# Patient Record
Sex: Female | Born: 1990 | Race: Black or African American | Hispanic: No | Marital: Married | Smoking: Former smoker
Health system: Southern US, Community
[De-identification: ages and names within clinical notes are randomized; demographics above are authoritative.]

## PROBLEM LIST (undated history)

## (undated) DIAGNOSIS — T7840XA Allergy, unspecified, initial encounter: Secondary | ICD-10-CM

## (undated) DIAGNOSIS — H5210 Myopia, unspecified eye: Secondary | ICD-10-CM

## (undated) DIAGNOSIS — D649 Anemia, unspecified: Secondary | ICD-10-CM

## (undated) HISTORY — DX: Myopia, unspecified eye: H52.10

## (undated) HISTORY — DX: Allergy, unspecified, initial encounter: T78.40XA

## (undated) HISTORY — DX: Anemia, unspecified: D64.9

---
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
## 2009-03-18 ENCOUNTER — Emergency Department (HOSPITAL_COMMUNITY): Admission: EM | Admit: 2009-03-18 | Discharge: 2009-03-18 | Payer: Self-pay | Admitting: Emergency Medicine

## 2009-05-07 ENCOUNTER — Emergency Department (HOSPITAL_COMMUNITY): Admission: EM | Admit: 2009-05-07 | Discharge: 2009-05-07 | Payer: Self-pay | Admitting: Family Medicine

## 2010-06-01 ENCOUNTER — Emergency Department (HOSPITAL_COMMUNITY): Admission: EM | Admit: 2010-06-01 | Discharge: 2010-06-01 | Payer: Self-pay | Admitting: Emergency Medicine

## 2010-07-15 ENCOUNTER — Emergency Department (HOSPITAL_COMMUNITY): Admission: EM | Admit: 2010-07-15 | Discharge: 2010-03-17 | Payer: Self-pay | Admitting: Emergency Medicine

## 2010-10-10 ENCOUNTER — Emergency Department (HOSPITAL_COMMUNITY)
Admission: EM | Admit: 2010-10-10 | Discharge: 2010-10-10 | Disposition: A | Discharge status: Home / Self Care | Payer: Self-pay | Attending: Emergency Medicine | Admitting: Emergency Medicine

## 2010-10-10 DIAGNOSIS — K029 Dental caries, unspecified: Secondary | ICD-10-CM | POA: Insufficient documentation

## 2010-10-10 DIAGNOSIS — J45909 Unspecified asthma, uncomplicated: Secondary | ICD-10-CM | POA: Insufficient documentation

## 2010-10-10 DIAGNOSIS — K089 Disorder of teeth and supporting structures, unspecified: Secondary | ICD-10-CM | POA: Insufficient documentation

## 2010-11-23 LAB — URINALYSIS, ROUTINE W REFLEX MICROSCOPIC
Hgb urine dipstick: NEGATIVE
Protein, ur: NEGATIVE mg/dL
Urobilinogen, UA: 1 mg/dL (ref 0.0–1.0)

## 2010-11-23 LAB — URINE MICROSCOPIC-ADD ON

## 2010-11-23 LAB — POCT PREGNANCY, URINE: Preg Test, Ur: NEGATIVE

## 2010-11-23 LAB — WET PREP, GENITAL
Trich, Wet Prep: NONE SEEN
Yeast Wet Prep HPF POC: NONE SEEN

## 2011-05-09 LAB — DIFFERENTIAL
Basophils Absolute: 0.1
Basophils Relative: 1
Lymphocytes Relative: 40
Monocytes Absolute: 0.4
Neutro Abs: 3
Neutrophils Relative %: 48

## 2011-05-09 LAB — URINALYSIS, ROUTINE W REFLEX MICROSCOPIC
Nitrite: NEGATIVE
Protein, ur: 30 — AB
Specific Gravity, Urine: 1.024
Urobilinogen, UA: 0.2

## 2011-05-09 LAB — CBC
HCT: 35 — ABNORMAL LOW
Hemoglobin: 11.6 — ABNORMAL LOW
MCV: 96
Platelets: 318
RDW: 13.8

## 2011-05-09 LAB — COMPREHENSIVE METABOLIC PANEL
Albumin: 3.6
Alkaline Phosphatase: 60
BUN: 9
Creatinine, Ser: 0.73
Glucose, Bld: 89
Total Bilirubin: 0.4
Total Protein: 7.1

## 2011-05-09 LAB — URINE MICROSCOPIC-ADD ON

## 2011-05-20 ENCOUNTER — Emergency Department (HOSPITAL_COMMUNITY): Payer: Self-pay

## 2011-05-20 ENCOUNTER — Emergency Department (HOSPITAL_COMMUNITY)
Admission: EM | Admit: 2011-05-20 | Discharge: 2011-05-20 | Disposition: A | Discharge status: Home / Self Care | Payer: Self-pay | Attending: Emergency Medicine | Admitting: Emergency Medicine

## 2011-05-20 DIAGNOSIS — M25569 Pain in unspecified knee: Secondary | ICD-10-CM | POA: Insufficient documentation

## 2011-05-20 DIAGNOSIS — M545 Low back pain, unspecified: Secondary | ICD-10-CM | POA: Insufficient documentation

## 2011-05-20 DIAGNOSIS — J45909 Unspecified asthma, uncomplicated: Secondary | ICD-10-CM | POA: Insufficient documentation

## 2011-05-20 LAB — URINALYSIS, ROUTINE W REFLEX MICROSCOPIC
Bilirubin Urine: NEGATIVE
Glucose, UA: NEGATIVE mg/dL
Specific Gravity, Urine: 1.02 (ref 1.005–1.030)
Urobilinogen, UA: 0.2 mg/dL (ref 0.0–1.0)
pH: 6 (ref 5.0–8.0)

## 2011-05-20 LAB — URINE MICROSCOPIC-ADD ON

## 2011-05-20 LAB — POCT PREGNANCY, URINE: Preg Test, Ur: NEGATIVE

## 2011-06-04 ENCOUNTER — Emergency Department (HOSPITAL_COMMUNITY)
Admission: EM | Admit: 2011-06-04 | Discharge: 2011-06-04 | Disposition: A | Discharge status: Home / Self Care | Payer: Self-pay | Attending: Emergency Medicine | Admitting: Emergency Medicine

## 2011-06-04 DIAGNOSIS — R51 Headache: Secondary | ICD-10-CM | POA: Insufficient documentation

## 2011-06-04 DIAGNOSIS — K029 Dental caries, unspecified: Secondary | ICD-10-CM | POA: Insufficient documentation

## 2011-06-04 DIAGNOSIS — J45909 Unspecified asthma, uncomplicated: Secondary | ICD-10-CM | POA: Insufficient documentation

## 2011-06-04 DIAGNOSIS — K089 Disorder of teeth and supporting structures, unspecified: Secondary | ICD-10-CM | POA: Insufficient documentation

## 2011-07-11 ENCOUNTER — Emergency Department (HOSPITAL_COMMUNITY)
Admission: EM | Admit: 2011-07-11 | Discharge: 2011-07-11 | Discharge status: Against Medical Advice | Payer: Self-pay | Attending: Emergency Medicine | Admitting: Emergency Medicine

## 2011-07-11 DIAGNOSIS — M25569 Pain in unspecified knee: Secondary | ICD-10-CM | POA: Insufficient documentation

## 2011-07-21 ENCOUNTER — Encounter: Payer: Self-pay | Admitting: Nurse Practitioner

## 2011-07-21 ENCOUNTER — Emergency Department (HOSPITAL_COMMUNITY)
Admission: EM | Admit: 2011-07-21 | Discharge: 2011-07-21 | Disposition: A | Discharge status: Home / Self Care | Payer: Self-pay | Source: Home / Self Care

## 2011-07-21 ENCOUNTER — Observation Stay (HOSPITAL_COMMUNITY)
Admission: EM | Admit: 2011-07-21 | Discharge: 2011-07-21 | Disposition: A | Discharge status: Home / Self Care | Payer: Self-pay | Attending: Emergency Medicine | Admitting: Emergency Medicine

## 2011-07-21 DIAGNOSIS — K089 Disorder of teeth and supporting structures, unspecified: Principal | ICD-10-CM | POA: Insufficient documentation

## 2011-07-21 MED ORDER — PENICILLIN V POTASSIUM 500 MG PO TABS: 500.0000 mg | ORAL_TABLET | Freq: Three times a day (TID) | ORAL | Status: AC

## 2011-07-21 MED ORDER — OXYCODONE-ACETAMINOPHEN 5-325 MG PO TABS: 1.0000 | ORAL_TABLET | ORAL | Status: AC | PRN

## 2011-07-21 NOTE — ED Notes (Signed)
Pt c/o tooth L lower back toothache x 2 months. Had a dental appointment today but was unable to make it due to road work.

## 2011-07-21 NOTE — ED Provider Notes (Signed)
History     CSN: [id number] Arrival date & time: 07/21/2011 11:52 AM   First MD Initiated Contact with Patient 07/21/11 1210     12:43 PM Patient reports several months of left lower toothache. States she had a dentist appointment states she was unable to make it. States that dentist office called her and said that the dentist was unable to come in today. Denies fever, abscess, swelling, difficulty swallowing, difficulty breathing. Reports known cavity in third lower left molar. Patient is a 20 y.o. female presenting with tooth pain. The history is provided by the patient.  Dental PainThe primary symptoms include mouth pain and headaches. Primary symptoms do not include dental injury, oral bleeding, oral lesions, fever, shortness of breath, sore throat, angioedema or cough. The symptoms are worsening. The symptoms are chronic. The symptoms occur constantly.  Mouth pain occurs constantly. Mouth pain is worsening. Affected locations include: teeth. At its highest the mouth pain was at 10/10. The mouth pain is currently at 10/10.  The headache is not associated with neck stiffness.  Additional symptoms include: dental sensitivity to temperature, jaw pain and ear pain. Additional symptoms do not include: gum swelling, gum tenderness, purulent gums, trismus, trouble swallowing, pain with swallowing and swollen glands.    Past Medical History  Diagnosis Date  . Asthma     History reviewed. No pertinent past surgical history.  History reviewed. No pertinent family history.  History  Substance Use Topics  . Smoking status: Never Smoker   . Smokeless tobacco: Not on file  . Alcohol Use: No    OB History    Grav Para Term Preterm Abortions TAB SAB Ect Mult Living                  Review of Systems  Constitutional: Negative for fever and chills.  HENT: Positive for ear pain. Negative for sore throat, trouble swallowing, neck pain and neck stiffness.        Positive for toothache    Respiratory: Negative for cough and shortness of breath.   Neurological: Positive for headaches. Negative for dizziness and light-headedness.  All other systems reviewed and are negative.    Allergies  Review of patient's allergies indicates no known allergies.  Home Medications   Current Outpatient Rx  Name Route Sig Dispense Refill  . IBUPROFEN 600 MG PO TABS Oral Take 600 mg by mouth daily as needed. For pain       BP 120/80  Pulse 89  Temp(Src) 98.1 F (36.7 C) (Oral)  Resp 16  Ht 5\' 4"  (1.626 m)  Wt 260 lb (117.935 kg)  BMI 44.63 kg/m2  SpO2 97%  Physical Exam  Vitals reviewed. Constitutional: She is oriented to person, place, and time. Vital signs are normal. She appears well-developed and well-nourished. No distress.  HENT:  Head: Normocephalic and atraumatic. No trismus in the jaw.  Mouth/Throat: Uvula is midline, oropharynx is clear and moist and mucous membranes are normal. Dental caries present. No dental abscesses or uvula swelling.    Eyes: Pupils are equal, round, and reactive to light.  Neck: Neck supple.  Pulmonary/Chest: Effort normal.  Neurological: She is alert and oriented to person, place, and time.  Skin: Skin is warm and dry. No rash noted. No erythema. No pallor.  Psychiatric: She has a normal mood and affect. Her behavior is normal.    ED Course  Procedures   MDM   Spoke with Dr. Marlane Hatcher office. Patient has an appointment scheduled  for tomorrow at 12 PM. Will prescribe patient antibiotics and pain medication so appointment can occur.       Thomasene Lot, [place] 07/21/11 1254  Thomasene Lot, PA 07/21/11 1301  1:38 PM Patient reports she's had some vaginal itching after taking penicillin. Denies vaginal discharge. Refusing to have pelvic exam. Will provide patient with recommendations for vaginal itching. Advised return if she feels she may have a yeast infection.   Thomasene Lot, [place] 07/21/11 [phone]

## 2011-07-21 NOTE — ED Notes (Signed)
Patient C/O having tooth pain in her left lower jaw. C/O having a headache for 2 days and states that her left ear is uncomfortable.  States that she was scheduled to see her dentist today but she was not seen. States that she came directly to the ED.  States that her tooth pain has been intermittent for 2 months, but is now continuous.

## 2011-07-21 NOTE — ED Provider Notes (Signed)
Medical screening examination/treatment/procedure(s) were performed by non-physician practitioner and as supervising physician I was immediately available for consultation/collaboration.  Doug Sou, MD 07/21/11 2043

## 2011-09-07 ENCOUNTER — Encounter: Payer: Self-pay | Admitting: Medical

## 2011-09-07 ENCOUNTER — Ambulatory Visit (INDEPENDENT_AMBULATORY_CARE_PROVIDER_SITE_OTHER): Payer: Self-pay | Admitting: Medical

## 2011-09-07 VITALS — BP 110/70 | HR 100 | Temp 98.1°F | Resp 18 | Ht 62.5 in | Wt 266.0 lb

## 2011-09-07 DIAGNOSIS — G479 Sleep disorder, unspecified: Secondary | ICD-10-CM

## 2011-09-07 DIAGNOSIS — E669 Obesity, unspecified: Secondary | ICD-10-CM

## 2011-09-07 DIAGNOSIS — M549 Dorsalgia, unspecified: Secondary | ICD-10-CM

## 2011-09-07 DIAGNOSIS — M25569 Pain in unspecified knee: Secondary | ICD-10-CM

## 2011-09-07 NOTE — Patient Instructions (Signed)
Based on your symptoms, you may have sleep apnea.    Given your sleep symptoms, back pain and knee pain, I do think this is related in part to your weight.   I recommend you make every attempt possible to work on losing weight.   Try and keep your daily food and beverage intake to 1800 calories per day. Exercise daily, preferably 30-60 minutes daily such as walking.    Consider Weight Watchers program to help with weight loss.   Stretch daily to help the back.    Regarding sleep issues - elevate the head of your bed.   Sleep Apnea Sleep apnea is a common disorder. The main problem of this disorder is excessive daytime sleepiness and compromised quality of life. This may include social and emotional problems. There are two types of sleep apnea.  Obstructive sleep apnea is when breathing stops due to a blocked airway.   Central sleep apnea is a malfunction of the brain's normal signal to breathe.  SYMPTOMS  Restless sleep.   Falling asleep while driving and/or during the day.   Loss of energy.   Irritability.   Mood or behavior changes.   Loud, heavy snoring.   Morning headaches.   Trouble concentrating.   Forgetfulness.   Anxiety or depression.   Decreased interest in sex.  Not all people with sleep apnea have all of these symptoms. However, people who have a few of these symptoms should visit their caregiver for an evaluation. Problems related to untreated sleep apnea include:  High blood pressure (hypertension).   Coronary artery disease.   Impotence.   Cognitive dysfunction.   Memory loss.  TREATMENT  For mild cases, treatment may include avoiding sleeping on one's back.   For people with nasal congestion, a decongestant may be prescribed.   Patients with obstructive and central apnea should avoid depressants. This includes alcohol, sedatives and narcotics. Weight loss and diet control are encouraged for overweight patients.   Many serious cases of  obstructive sleep apnea can be relieved by a treatment called nasal continuous positive airway pressure (nasal CPAP). Nasal CPAP uses a mask-like device and pump that work together to keep the airway open. The pump delivers air pressure during each breath.   Surgery may help some patients by stopping or reducing the narrowing of the airway due to anatomical defects.  PROGNOSIS  Removing the obstruction usually reverses hypertension and cardiac problems. Untreated, sleep apnea sufferers have a tendency to fall asleep during the day. This is can result in serious accident or loss of ones job. RESEARCH Sleep apnea is currently one of the most active areas of sleep research.  Document Released: 07/15/2002 Document Revised: 04/06/2011 Document Reviewed: 11/10/2005 [facility] Patient Information 2012 [place], [place].  Back Pain, Adult Low back pain is very common. About 1 in 5 people have back pain.The cause of low back pain is rarely dangerous. The pain often gets better over time.About half of people with a sudden onset of back pain feel better in just 2 weeks. About 8 in 10 people feel better by 6 weeks.  CAUSES Some common causes of back pain include:  Strain of the muscles or ligaments supporting the spine.   Wear and tear (degeneration) of the spinal discs.   Arthritis.   Direct injury to the back.  DIAGNOSIS Most of the time, the direct cause of low back pain is not known.However, back pain can be treated effectively even when the exact cause of the pain is  unknown.Answering your caregiver's questions about your overall health and symptoms is one of the most accurate ways to make sure the cause of your pain is not dangerous. If your caregiver needs more information, he or she may order lab work or imaging tests (X-rays or MRIs).However, even if imaging tests show changes in your back, this usually does not require surgery. HOME CARE INSTRUCTIONS For many people, back pain returns.Since  low back pain is rarely dangerous, it is often a condition that people can learn to [facility] their own.   Remain active. It is stressful on the back to sit or stand in one place. Do not sit, drive, or stand in one place for more than 30 minutes at a time. Take short walks on level surfaces as soon as pain allows.Try to increase the length of time you walk each day.   Do not stay in bed.Resting more than 1 or 2 days can delay your recovery.   Do not avoid exercise or work.Your body is made to move.It is not dangerous to be active, even though your back may hurt.Your back will likely heal faster if you return to being active before your pain is gone.   Pay attention to your body when you bend and lift. Many people have less discomfortwhen lifting if they bend their knees, keep the load close to their bodies,and avoid twisting. Often, the most comfortable positions are those that put less stress on your recovering back.   Find a comfortable position to sleep. Use a firm mattress and lie on your side with your knees slightly bent. If you lie on your back, put a pillow under your knees.   Only take over-the-counter or prescription medicines as directed by your caregiver. Over-the-counter medicines to reduce pain and inflammation are often the most helpful.Your caregiver may prescribe muscle relaxant drugs.These medicines help dull your pain so you can more quickly return to your normal activities and healthy exercise.   Put ice on the injured area.   Put ice in a plastic bag.   Place a towel between your skin and the bag.   Leave the ice on for 15 to 20 minutes, 3 to 4 times a day for the first 2 to 3 days. After that, ice and heat may be alternated to reduce pain and spasms.   Ask your caregiver about trying back exercises and gentle massage. This may be of some benefit.   Avoid feeling anxious or stressed.Stress increases muscle tension and can worsen back pain.It is important to  recognize when you are anxious or stressed and learn ways to manage it.Exercise is a great option.  SEEK MEDICAL CARE IF:  You have pain that is not relieved with rest or medicine.   You have pain that does not improve in 1 week.   You have new symptoms.   You are generally not feeling well.  SEEK IMMEDIATE MEDICAL CARE IF:   You have pain that radiates from your back into your legs.   You develop new bowel or bladder control problems.   You have unusual weakness or numbness in your arms or legs.   You develop nausea or vomiting.   You develop abdominal pain.   You feel faint.  Document Released: 07/25/2005 Document Revised: 04/06/2011 Document Reviewed: 12/13/2010 [facility] Patient Information 2012 [place], [place].

## 2011-09-07 NOTE — Progress Notes (Signed)
  Subjective:   HPI  Lindsay Roach is a 21 y.o. female who presents as a new patient today.   No recent primary care anywhere else.  She has several c/o today.  Her main c/o is concern for sleep apnea.  She notes that her fiance says she snores, she stops breathing in her sleep several times nightly for long periods of times, certainly more than a few seconds.    She reports 2 year hx/o back pain, worse with walking, sometimes pains shoot down her right leg.   At times whole leg feels numb.  She notes right knee pain, intermittent, worse with legs folded.  No other aggravating or relieving factors.    No other c/o.  The following portions of the patient's history were reviewed and updated as appropriate: allergies, current medications, past family history, past medical history, past social history, past surgical history and problem list.  Past Medical History  Diagnosis Date  . Asthma   . Allergy   . Anemia   . 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   Review of Systems Constitutional: denies fever, chills, sweats, unexpected weight change, fatigue ENT: no runny nose, ear pain, sore throat Cardiology: denies chest pain, palpitations, edema Respiratory: denies cough, shortness of breath, wheezing Gastroenterology: denies abdominal pain, nausea, vomiting, diarrhea, constipation  Hematology: denies bleeding or bruising problems Musculoskeletal: denies myalgias, joint swelling Ophthalmology: denies vision changes Urology: denies dysuria, difficulty urinating, hematuria, urinary frequency, urgency Neurology: no headache, weakness, tingling    Objective:   Physical Exam  General appearance: alert, no distress, WD/WN, obese black female HEENT: normocephalic, sclerae anicteric, TMs pearly, nares patent, no discharge or erythema, pharynx normal Oral cavity: MMM, no lesions, moderately sized tonsils Neck:  supple, no lymphadenopathy, no thyromegaly, no masses Heart: RRR, normal S1, S2, no murmurs Lungs: CTA bilaterally, no wheezes, rhonchi, or rales Back: lumbar tenderness generalized, ROM full but with mild pain, no midline tenderness Pulses: 2+ symmetric, upper and lower extremities, normal cap refill Neuro: -SLR, normal LE strength and sensation, normal DTRs   Assessment and Plan :     Encounter Diagnoses  Name Primary?  . Sleep disturbance Yes  . Back pain   . Knee pain   . Obesity     Sleep disturbance - she is self pay.  We discussed in home free sleep apnea screen through apnea.  Also gave her expectation of what CPAP would cost.  She will let me know about the sleep study.  I recommended significant diet and exercise changes to lose weight, advised raising head of bed, consider OTC mouth guard for sleep apnea.  Back pain - musculoskeletal/paraspinal strain, advised weight loss, diet and exercise changes, strategic, Aleve OTC.  Knee pain - right tendonitis.  Advised rest, but that the focus should be weight loss to help take stress off legs and back.  Obesity - lifestyle changes recommended.

## 2011-12-14 ENCOUNTER — Emergency Department (HOSPITAL_COMMUNITY)
Admission: EM | Admit: 2011-12-14 | Discharge: 2011-12-14 | Disposition: A | Discharge status: Home / Self Care | Payer: Self-pay | Attending: Emergency Medicine | Admitting: Emergency Medicine

## 2011-12-14 ENCOUNTER — Encounter (HOSPITAL_COMMUNITY): Payer: Self-pay | Admitting: Emergency Medicine

## 2011-12-14 DIAGNOSIS — S8390XA Sprain of unspecified site of unspecified knee, initial encounter: Secondary | ICD-10-CM

## 2011-12-14 DIAGNOSIS — M25569 Pain in unspecified knee: Secondary | ICD-10-CM | POA: Insufficient documentation

## 2011-12-14 DIAGNOSIS — S0990XA Unspecified injury of head, initial encounter: Secondary | ICD-10-CM | POA: Insufficient documentation

## 2011-12-14 DIAGNOSIS — M549 Dorsalgia, unspecified: Secondary | ICD-10-CM

## 2011-12-14 DIAGNOSIS — R51 Headache: Secondary | ICD-10-CM | POA: Insufficient documentation

## 2011-12-14 DIAGNOSIS — IMO0002 Reserved for concepts with insufficient information to code with codable children: Secondary | ICD-10-CM | POA: Insufficient documentation

## 2011-12-14 DIAGNOSIS — W010XXA Fall on same level from slipping, tripping and stumbling without subsequent striking against object, initial encounter: Secondary | ICD-10-CM | POA: Insufficient documentation

## 2011-12-14 DIAGNOSIS — J45909 Unspecified asthma, uncomplicated: Secondary | ICD-10-CM | POA: Insufficient documentation

## 2011-12-14 MED ORDER — HYDROCODONE-ACETAMINOPHEN 5-500 MG PO TABS: 1.0000 | ORAL_TABLET | Freq: Four times a day (QID) | ORAL | Status: AC | PRN

## 2011-12-14 MED ORDER — IBUPROFEN 800 MG PO TABS: 800.0000 mg | ORAL_TABLET | Freq: Three times a day (TID) | ORAL | Status: AC

## 2011-12-14 MED ORDER — HYDROCODONE-ACETAMINOPHEN 5-325 MG PO TABS
2.0000 | ORAL_TABLET | Freq: Once | ORAL | Status: AC
  Administered 2011-12-14: 2 via ORAL
  Filled 2011-12-14: qty 2

## 2011-12-14 NOTE — ED Notes (Signed)
Pt fell this evening at home  Pt states she hit her back and head on the floor and twisted her right leg  Pt is c/o right knee pain, low back pain, and headache

## 2011-12-14 NOTE — Discharge Instructions (Signed)
Wear knee immobilizer for at least 2 weeks for stabilization of knee. Use crutches as needed for comfort. Ice and elevate knee throughout the day. Alternate between ibuprofen and vicodin (hydrocodone-acetaminophen) for pain relief. Do not drive or operate machinery with vicodin use. Call orthopedic follow up today or tomorrow to schedule followup appointment for recheck of ongoing knee pain in one to two weeks that can be canceled with a 24-48 hour notice if complete resolution of pain. Return to ER for any emergent changing or worsening of symptoms   Knee Sprain You have a knee sprain. Sprains are painful injuries to the joints. A sprain is a partial or complete tearing of ligaments. Ligaments are tough, fibrous tissues that hold bones together at the joints. A strain (sprain) has occurred when a ligament is stretched or damaged. This injury may take several weeks to heal. This is often the same length of time as a bone fracture (break in bone) takes to heal. Even though a fracture (bone break) may not have occurred, the recovery times may be similar. HOME CARE INSTRUCTIONS   Rest the injured area for as long as directed by your caregiver. Then slowly start using the joint as directed by your caregiver and as the pain allows. Use crutches as directed. If the knee was splinted or casted, continue use and care as directed. If an ace bandage has been applied today, it should be removed and reapplied every 3 to 4 hours. It should not be applied tightly, but firmly enough to keep swelling down. Watch toes and feet for swelling, bluish discoloration, coldness, numbness or excessive pain. If any of these symptoms occur, remove the ace bandage and reapply more loosely.If these symptoms persist, seek medical attention.   For the first 24 hours, lie down. Keep the injured extremity elevated on two pillows.   Apply ice to the injured area for 15 to 20 minutes every couple hours. Repeat this 3 to 4 times per day for  the first 48 hours. Put the ice in a plastic bag and place a towel between the bag of ice and your skin.   Wear any splinting, casting, or elastic bandage applications as instructed.   Only take over-the-counter or prescription medicines for pain, discomfort, or fever as directed by your caregiver. Do not use aspirin immediately after the injury unless instructed by your caregiver. Aspirin can cause increased bleeding and bruising of the tissues.   If you were given crutches, continue to use them as instructed. Do not resume weight bearing on the affected extremity until instructed.  Persistent pain and inability to use the injured area as directed for more than 2 to 3 days are warning signs. If this happens you should see a caregiver for a follow-up visit as soon as possible. Initially, a hairline fracture (this is the same as a broken bone) may not be evident on x-rays. Persistent pain and swelling indicate that further evaluation, non-weight bearing (use of crutches as instructed), and/or further x-rays are indicated. X-rays may sometimes not show a small fracture until a week or ten days later. Make a follow-up appointment with your own caregiver or one to whom we have referred you. A radiologist (specialist in reading x-rays) may re-read your X-rays. Make sure you know how you are to get your x-ray results. Do not assume everything is normal if you do not hear from [place]. SEEK MEDICAL CARE IF:   Bruising, swelling, or pain increases.   You have cold or numb  toes   You have continuing difficulty or pain with walking.  SEEK IMMEDIATE MEDICAL CARE IF:   Your toes are cold, numb or blue.   The pain is not responding to medications and continues to stay the same or get worse.  MAKE SURE YOU:   Understand these instructions.   Will watch your condition.   Will get help right away if you are not doing well or get worse.  Document Released: 07/25/2005 Document Revised: 07/14/2011 Document  Reviewed: 07/09/2007 [facility] Patient Information 2012 [place], [place].  Head Injury, Adult A head injury happens when the head is hit really hard. A head injury may cause sleepiness, headache, throwing up (vomiting), and problems seeing. If the head injury is really bad, you may need to stay in the hospital. HOME CARE  Have someone with you for the first 24 hours. This person should wake you up every 1 hour to check on your condition.   Only drink water or clear fluid for the rest of the day. Then, go back to your regular diet.   Only take medicines as told by your doctor. Do not take aspirin.   Do not drink alcohol for 2 days.   Do not take medicines that help your relax (sedatives) for 2 days.  Side effects may happen for up to 7 to 10 days. Watch for new problems. GET HELP RIGHT AWAY IF:   You are confused or sleepy.   You cannot be woken up.   You feel sick to your stomach (nauseous) or keep throwing up.   Your dizziness or unsteadiness is get worse, or your cannot walk.   You start to shake (convulse) or pass out (faint).   You have very bad, lasting headaches that are not helped by medicine.   You cannot use your arms or legs like normal.   You have clear or bloody fluid coming from your nose or ears.  MAKE SURE YOU:   Understand these instructions.   Will watch your condition.   Will get help right away if you are not doing well or get worse.  Document Released: 07/07/2008 Document Revised: 07/14/2011 Document Reviewed: 06/10/2009 [facility] Patient Information 2012 [place], [place].

## 2011-12-15 NOTE — ED Provider Notes (Signed)
Medical screening examination/treatment/procedure(s) were performed by non-physician practitioner and as supervising physician I was immediately available for consultation/collaboration.   Lyanne Co, MD 12/15/11 [phone]

## 2011-12-15 NOTE — ED Provider Notes (Signed)
History     CSN: [id number]  Arrival date & time 12/14/11  2126   First MD Initiated Contact with Patient 12/14/11 2133      Chief Complaint  Patient presents with  . Knee Pain  . Back Pain  . Headache    (Consider location/radiation/quality/duration/timing/severity/associated sxs/prior treatment) HPI  Patient presents to the emergency department complaining of right knee pain, back pain, and headache stating that just prior to arrival she tripped on a bottle that was on her floor causing her to fall backwards striking her head on the couch, twisting her knee, and landing on her back. Patient states she had acute onset pain. Patient took nothing for pain prior to arrival. Patient states pain in her back and knee are aggravated by movement. Patient states headache has been constant. Patient denies loss of consciousness, dizziness, nausea, or vomiting. Patient denies any extremity deformity, extremity numbness/tingling/weakness, or break in skin.  Past Medical History  Diagnosis Date  . Asthma   . Allergy   . Anemia   . 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 No pertinent past surgical history.  History reviewed. No pertinent family history.  History  Substance Use Topics  . Smoking status: Former Smoker    Types: Cigarettes    Quit date: 05/08/2011  . Smokeless tobacco: Not on file  . Alcohol Use: No    OB History    Grav Para Term Preterm Abortions TAB SAB Ect Mult Living                  Review of Systems  Allergies  Penicillins  Home Medications   Current Outpatient Rx  Name Route Sig Dispense Refill  . ALBUTEROL SULFATE HFA 108 (90 BASE) MCG/ACT IN AERS Inhalation Inhale 2 puffs into the lungs every 6 (six) hours as needed. For shortness of breath    . HYDROCODONE-ACETAMINOPHEN 5-500 MG PO TABS Oral Take 1-2 tablets by mouth every 6 (six) hours as needed for pain. 15 tablet 0  . IBUPROFEN 800 MG PO TABS Oral Take 1 tablet (800 mg total) by  mouth 3 (three) times daily. Take 800mg  by mouth at breakfast, lunch and dinner for the next 5 days 21 tablet 0    BP 138/82  Pulse 88  Temp(Src) 99 F (37.2 C) (Oral)  Resp 20  SpO2 94%  LMP 12/09/2011  Physical Exam  Nursing note and vitals reviewed. Constitutional: She is oriented to person, place, and time. She appears well-developed and well-nourished. No distress.  HENT:  Head: Normocephalic and atraumatic.       Nontender and atraumatic on palpation.  Eyes: Conjunctivae and EOM are normal. Pupils are equal, round, and reactive to light.  Neck: Normal range of motion. Neck supple.  Cardiovascular: Normal rate, regular rhythm, normal heart sounds and intact distal pulses.  Exam reveals no gallop and no friction rub.   No murmur heard. Pulmonary/Chest: Effort normal and breath sounds normal. No respiratory distress. She has no wheezes. She has no rales. She exhibits no tenderness.  Abdominal: Soft. Bowel sounds are normal. She exhibits no distension and no mass. There is no tenderness. There is no rebound and no guarding.  Musculoskeletal: Normal range of motion. She exhibits tenderness. She exhibits no edema.       Tenderness to palpation of entire right knee but no laxity with anterior/posterior/medial/lateral stress. Negative ballottement sign. No swelling. Full range of motion knee without crepitus but with mild pain.  Mild tenderness to palpation of lumbar paraspinal region without midline tenderness to palpation. Full range of motion of neck without pain. 5/5 strength of bilateral upper and lower extremities.  Neurological: She is alert and oriented to person, place, and time. She has normal reflexes.  Skin: Skin is warm and dry. No rash noted. She is not diaphoretic. No erythema.  Psychiatric: She has a normal mood and affect.    ED Course  Procedures (including critical care time)  PO hydrocodone  Labs Reviewed - No data to display No results found.   1. Knee sprain    2. Back pain   3. Minor head injury       MDM  Patient minor head injury without loss of consciousness. She has no midline spinal tenderness to palpation. Patient had a twisting injury to her knee which is most consistent with soft tissue injury. There is no crepitus laxity with full range of motion. I spoke at length with patient about soft tissue injury to knee/knee sprain and the need for orthopedic followup in the future. Patient has no signs or symptoms of central cord compression or cauda equina complaining of her lower back pain is most consistent with muscle strain. She has no neuro focal findings and is angulating with the crutches without difficulty. I gave strict precautions for return to the emergency department for changing or worsening symptoms otherwise have patient followup with her primary care provider or orthopedic specialist as needed. Patient voices understanding and is agreeable plan.        Lenon Oms [place], [place] 12/15/11 [phone]

## 2012-04-28 ENCOUNTER — Emergency Department (HOSPITAL_COMMUNITY)
Admission: EM | Admit: 2012-04-28 | Discharge: 2012-04-28 | Disposition: A | Discharge status: Home / Self Care | Payer: Self-pay | Attending: Emergency Medicine | Admitting: Emergency Medicine

## 2012-04-28 ENCOUNTER — Encounter (HOSPITAL_COMMUNITY): Payer: Self-pay | Admitting: Emergency Medicine

## 2012-04-28 DIAGNOSIS — M25569 Pain in unspecified knee: Secondary | ICD-10-CM | POA: Insufficient documentation

## 2012-04-28 DIAGNOSIS — J45909 Unspecified asthma, uncomplicated: Secondary | ICD-10-CM | POA: Insufficient documentation

## 2012-04-28 DIAGNOSIS — Z87891 Personal history of nicotine dependence: Secondary | ICD-10-CM | POA: Insufficient documentation

## 2012-04-28 NOTE — ED Provider Notes (Signed)
Medical screening examination/treatment/procedure(s) were performed by non-physician practitioner and as supervising physician I was immediately available for consultation/collaboration.    Holly Pring R Jendaya [place], MD 04/28/12 1634 

## 2012-04-28 NOTE — ED Provider Notes (Signed)
History     CSN: [id number]  Arrival date & time 04/28/12  1110   First MD Initiated Contact with Patient 04/28/12 1115      Chief Complaint  Patient presents with  . Leg Pain  . Knee Pain    (Consider location/radiation/quality/duration/timing/severity/associated sxs/prior treatment) HPI Comments: 21 year old morbidly obese female presents the emergency department with worsening right knee pain over the past couple days. She's had knee pain for "months". Pain worse when she gets up and walks for a while, or if she's been sitting for [place].  States the pain is beginning to radiate to right hip. Denies any back pain. Denies any injury or trauma. She does not have any trouble ambulating. She has not tried taking anything for her pain. States her knee intermittently swells up.  Patient is a 21 y.o. female presenting with leg pain and knee pain. The history is provided by the patient.  Leg Pain  Pertinent negatives include no numbness.  Knee Pain Associated symptoms include arthralgias and joint swelling. Pertinent negatives include no numbness or weakness.    Past Medical History  Diagnosis Date  . Asthma   . Allergy   . Anemia   . 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 No pertinent past surgical history.  No family history on file.  History  Substance Use Topics  . Smoking status: Former Smoker    Types: Cigarettes    Quit date: 05/08/2011  . Smokeless tobacco: Not on file  . Alcohol Use: No    OB History    Grav Para Term Preterm Abortions TAB SAB Ect Mult Living                  Review of Systems  Constitutional: Negative for activity change.  Musculoskeletal: Positive for joint swelling and arthralgias. Negative for back pain and gait problem.  Skin: Negative for color change.  Neurological: Negative for weakness and numbness.    Allergies  Penicillins  Home Medications  No current outpatient prescriptions on file.  BP 146/90  Pulse  96  Temp 98.1 F (36.7 C) (Oral)  Resp 20  SpO2 96%  LMP 04/20/2012  Physical Exam  Constitutional: She is oriented to person, place, and time. She appears well-developed. No distress.       Morbidly obese  Eyes: Conjunctivae normal are normal.  Neck: Normal range of motion.  Cardiovascular: Normal rate, regular rhythm, normal heart sounds and intact distal pulses.   Pulmonary/Chest: Effort normal and breath sounds normal.  Musculoskeletal:       Right hip: Normal.       Right knee: She exhibits normal range of motion and no bony tenderness. tenderness found. Medial joint line tenderness noted.       Knee special tests difficult to assess due to patient's size.  Neurological: She is alert and oriented to person, place, and time. She has normal strength. No sensory deficit.  Skin: Skin is warm and dry. No erythema.  Psychiatric: She has a normal mood and affect. Her behavior is normal.    ED Course  Procedures (including critical care time)  Labs Reviewed - No data to display No results found.   1. Knee pain       MDM  21 year old morbidly obese female with right knee pain. Exam consistent with arthritis. I do not feel an x-ray is necessary at this time. Advised use of anti-inflammatories along with ice. She has no physician and  I will give her recent list for followup. She is ambulating without difficulty.        Trevor Mace, PA-C 04/28/12 1214

## 2012-04-28 NOTE — ED Notes (Signed)
Pt reports that she has R knee swelling and R leg pain that radiates to hip. Pt denies injury or surgery and reports that she woke up with the pain.

## 2012-04-30 ENCOUNTER — Telehealth: Payer: Self-pay | Admitting: Family Medicine

## 2012-04-30 NOTE — Telephone Encounter (Signed)
Message copied by Janeice Robinson on Mon Apr 30, 2012  4:59 PM ------      Message from: Jac Canavan      Created: Mon Apr 30, 2012  1:38 PM       i reviewed ED note from her recent visit regarding knee pain.   recommend f/u appt here in 2wk to readdress knee pain, weight, concerns.

## 2012-04-30 NOTE — Telephone Encounter (Signed)
I CALLED THE PATIENT AND LEFT A MESSAGE ABOUT HER SCHEDULING A FOLLOW UP APPOINTMENT WITH SHANE TYSINGER PA-C TO ADDRESS HER KNEE PAIN AND OTHER CONCERNS. CLS

## 2012-10-14 ENCOUNTER — Emergency Department (HOSPITAL_COMMUNITY)
Admission: EM | Admit: 2012-10-14 | Discharge: 2012-10-14 | Disposition: A | Discharge status: Home / Self Care | Payer: Self-pay | Attending: Emergency Medicine | Admitting: Emergency Medicine

## 2012-10-14 ENCOUNTER — Encounter (HOSPITAL_COMMUNITY): Payer: Self-pay | Admitting: Emergency Medicine

## 2012-10-14 DIAGNOSIS — Z862 Personal history of diseases of the blood and blood-forming organs and certain disorders involving the immune mechanism: Secondary | ICD-10-CM | POA: Insufficient documentation

## 2012-10-14 DIAGNOSIS — Z3202 Encounter for pregnancy test, result negative: Secondary | ICD-10-CM | POA: Insufficient documentation

## 2012-10-14 DIAGNOSIS — R109 Unspecified abdominal pain: Secondary | ICD-10-CM | POA: Insufficient documentation

## 2012-10-14 DIAGNOSIS — J45909 Unspecified asthma, uncomplicated: Secondary | ICD-10-CM | POA: Insufficient documentation

## 2012-10-14 DIAGNOSIS — M549 Dorsalgia, unspecified: Secondary | ICD-10-CM

## 2012-10-14 DIAGNOSIS — Z87891 Personal history of nicotine dependence: Secondary | ICD-10-CM | POA: Insufficient documentation

## 2012-10-14 DIAGNOSIS — M79609 Pain in unspecified limb: Secondary | ICD-10-CM | POA: Insufficient documentation

## 2012-10-14 DIAGNOSIS — M545 Low back pain, unspecified: Secondary | ICD-10-CM | POA: Insufficient documentation

## 2012-10-14 DIAGNOSIS — R300 Dysuria: Secondary | ICD-10-CM | POA: Insufficient documentation

## 2012-10-14 LAB — URINALYSIS, ROUTINE W REFLEX MICROSCOPIC
Ketones, ur: NEGATIVE mg/dL
Leukocytes, UA: NEGATIVE
Nitrite: NEGATIVE
pH: 7.5 (ref 5.0–8.0)

## 2012-10-14 MED ORDER — DIAZEPAM 5 MG/ML IJ SOLN
5.0000 mg | Freq: Once | INTRAMUSCULAR | Status: AC
  Administered 2012-10-14: 5 mg via INTRAVENOUS
  Filled 2012-10-14: qty 2

## 2012-10-14 MED ORDER — CYCLOBENZAPRINE HCL 10 MG PO TABS
10.0000 mg | ORAL_TABLET | Freq: Once | ORAL | Status: AC
  Administered 2012-10-14: 10 mg via ORAL
  Filled 2012-10-14: qty 1

## 2012-10-14 MED ORDER — KETOROLAC TROMETHAMINE 60 MG/2ML IM SOLN
60.0000 mg | Freq: Once | INTRAMUSCULAR | Status: AC
  Administered 2012-10-14: 60 mg via INTRAMUSCULAR
  Filled 2012-10-14: qty 2

## 2012-10-14 MED ORDER — NAPROXEN 500 MG PO TABS: 500.0000 mg | ORAL_TABLET | Freq: Two times a day (BID) | ORAL | Status: DC

## 2012-10-14 NOTE — ED Provider Notes (Signed)
History     CSN: [id number]  Arrival date & time 10/14/12  1711   First MD Initiated Contact with Patient 10/14/12 2009      Chief Complaint  Patient presents with  . Back Pain  . Leg Pain  . Abdominal Pain    (Consider location/radiation/quality/duration/timing/severity/associated sxs/prior treatment) HPI Comments: Patient is a 22 year old female who presents with a low back pain that started earlier today. Symptoms started gradually and progressively worsened since the onset. The pain is aching and moderate and radiates down her bilateral legs. Patient denies injury. No aggravating/alleviating factors. Patient reports associated dysuria. She has tried taking ibuprofen without relief. Patient denies fever, abdominal pain, NVD.    Past Medical History  Diagnosis Date  . Asthma   . Allergy   . Anemia   . 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  No family history on file.  History  Substance Use Topics  . Smoking status: Former Smoker    Types: Cigarettes    Quit date: 05/08/2011  . Smokeless tobacco: Not on file  . Alcohol Use: No    OB History   Grav Para Term Preterm Abortions TAB SAB Ect Mult Living                  Review of Systems  Genitourinary: Positive for dysuria.  Musculoskeletal: Positive for back pain.  All other systems reviewed and are negative.    Allergies  Tramadol and Penicillins  Home Medications  No current outpatient prescriptions on file.  BP 115/60  Pulse 90  Temp(Src) 97.9 F (36.6 C) (Oral)  Resp 18  SpO2 100%  LMP 09/30/2012  Physical Exam  Nursing note and vitals reviewed. Constitutional: She is oriented to person, place, and time. She appears well-developed and well-nourished. No distress.  HENT:  Head: Normocephalic and atraumatic.  Eyes: Conjunctivae are normal.  Neck: Normal range of motion.  Cardiovascular: Normal rate and regular rhythm.  Exam reveals no gallop and no friction rub.    No murmur heard. Pulmonary/Chest: Effort normal and breath sounds normal. She has no wheezes. She has no rales. She exhibits no tenderness.  Abdominal: Soft. She exhibits no distension. There is no tenderness. There is no rebound and no guarding.  Musculoskeletal: Normal range of motion.  Neurological: She is alert and oriented to person, place, and time. Coordination normal.  Speech is goal-oriented. Moves limbs without ataxia.   Skin: Skin is warm and dry.  Psychiatric: She has a normal mood and affect. Her behavior is normal.    ED Course  Procedures (including critical care time)  Labs Reviewed  URINALYSIS, ROUTINE W REFLEX MICROSCOPIC  POCT PREGNANCY, URINE   No results found.   1. Back pain       MDM  8:17 PM Urinalysis pending. Patient given Flexeril for back pain. Patient afebrile with stable vitals.   10:59 PM Urinalysis unremarkable. Urine pregnancy negative. Urine culture ordered and patient will be contacted with results if needed. Patient given toradol and valium for pain. I will discharge the patient without further evaluation. Patient instructed to return with worsening or concerning symptoms.       Emilia Beck, PA-C 10/14/12 2313

## 2012-10-14 NOTE — ED Notes (Signed)
Pt states that she was at home today and started having low back pain shooting down her legs.  States it feels like a "zing" down her legs.  Also asked if "she could get her ovaries checked".  States "I had a cyst on my ovaries the last time I was here and they have been bothering me last night."

## 2012-10-15 NOTE — ED Provider Notes (Signed)
Medical screening examination/treatment/procedure(s) were performed by non-physician practitioner and as supervising physician I was immediately available for consultation/collaboration.   Glynn Octave, MD 10/15/12 0001

## 2012-11-18 ENCOUNTER — Emergency Department (HOSPITAL_COMMUNITY)
Admission: EM | Admit: 2012-11-18 | Discharge: 2012-11-18 | Disposition: A | Discharge status: Home / Self Care | Payer: Self-pay | Attending: Emergency Medicine | Admitting: Emergency Medicine

## 2012-11-18 ENCOUNTER — Encounter (HOSPITAL_COMMUNITY): Payer: Self-pay | Admitting: Physical Medicine and Rehabilitation

## 2012-11-18 DIAGNOSIS — J45909 Unspecified asthma, uncomplicated: Secondary | ICD-10-CM | POA: Insufficient documentation

## 2012-11-18 DIAGNOSIS — M545 Low back pain, unspecified: Secondary | ICD-10-CM | POA: Insufficient documentation

## 2012-11-18 DIAGNOSIS — Z862 Personal history of diseases of the blood and blood-forming organs and certain disorders involving the immune mechanism: Secondary | ICD-10-CM | POA: Insufficient documentation

## 2012-11-18 DIAGNOSIS — Z87891 Personal history of nicotine dependence: Secondary | ICD-10-CM | POA: Insufficient documentation

## 2012-11-18 MED ORDER — HYDROCODONE-ACETAMINOPHEN 5-325 MG PO TABS: 1.0000 | ORAL_TABLET | Freq: Four times a day (QID) | ORAL | Status: DC | PRN

## 2012-11-18 MED ORDER — METHOCARBAMOL 500 MG PO TABS: 500.0000 mg | ORAL_TABLET | Freq: Two times a day (BID) | ORAL | Status: DC

## 2012-11-18 NOTE — ED Notes (Signed)
Pt presents to department for evaluation of bilateral leg soreness. Onset today. Ambulatory to triage. No obvious deformities noted. Denies recent injury. 5/10 pain at present. No signs of acute distress noted.

## 2012-11-18 NOTE — ED Provider Notes (Signed)
History     CSN: [id number]  Arrival date & time 11/18/12  1138   First MD Initiated Contact with Patient 11/18/12 1355      Chief Complaint  Patient presents with  . Leg Pain    (Consider location/radiation/quality/duration/timing/severity/associated sxs/prior treatment) HPI Comments: Patient presenting with lower back pain that has been present for the past 2 months.  No injury or trauma.  She was seen in the ED for the same one month ago.    Patient is a 22 y.o. female presenting with back pain. The history is provided by the patient.  Back Pain Location:  Lumbar spine Quality:  Unable to specify Radiates to:  L posterior upper leg and R posterior upper leg Pain severity:  Moderate Onset quality:  Gradual Duration:  2 months Progression:  Worsening Context comment:  Frequent bending Relieved by:  Nothing Worsened by:  Bending Ineffective treatments: Tramadol. Associated symptoms: leg pain   Associated symptoms: no abdominal pain, no bladder incontinence, no bowel incontinence, no dysuria, no fever, no numbness, no paresthesias and no weakness     Past Medical History  Diagnosis Date  . Asthma   . Allergy   . Anemia   . 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  No family history on file.  History  Substance Use Topics  . Smoking status: Former Smoker    Types: Cigarettes    Quit date: 05/08/2011  . Smokeless tobacco: Not on file  . Alcohol Use: No    OB History   Grav Para Term Preterm Abortions TAB SAB Ect Mult Living                  Review of Systems  Constitutional: Negative for fever and chills.  Gastrointestinal: Negative for abdominal pain and bowel incontinence.  Genitourinary: Negative for bladder incontinence and dysuria.  Musculoskeletal: Positive for back pain.  Neurological: Negative for weakness, numbness and paresthesias.  All other systems reviewed and are negative.    Allergies  Tramadol and  Penicillins  Home Medications  No current outpatient prescriptions on file.  BP 126/89  Pulse 69  Temp(Src) 99 F (37.2 C) (Oral)  Resp 18  SpO2 100%  Physical Exam  Nursing note and vitals reviewed. Constitutional: She is oriented to person, place, and time. She appears well-developed and well-nourished. No distress.  HENT:  Head: Normocephalic and atraumatic.  Neck: Normal range of motion and full passive range of motion without pain. Neck supple. No spinous process tenderness and no muscular tenderness present. No rigidity. Normal range of motion present.  Cardiovascular: Normal rate, regular rhythm, normal heart sounds and intact distal pulses.  Exam reveals no gallop and no friction rub.   No murmur heard. Pulmonary/Chest: Effort normal and breath sounds normal. No respiratory distress. She has no wheezes. She has no rales. She exhibits no tenderness.  Musculoskeletal:       Cervical back: She exhibits normal range of motion, no tenderness, no bony tenderness and no pain.       Thoracic back: She exhibits no tenderness, no bony tenderness and no pain.       Lumbar back: She exhibits tenderness, bony tenderness and pain. She exhibits no spasm and normal pulse.       Right foot: She exhibits no swelling.       Left foot: She exhibits no swelling.  Bilateral lower extremities nontender without color change, baseline range of  motion of extremities with intact distal pulses.  Pt has increased pain w ROM of lumbar spine. Pain w ambulation, no sign of ataxia.  Neurological: She is alert and oriented to person, place, and time. She has normal strength and normal reflexes. No sensory deficit. Gait normal.  Sensation at baseline for light touch in all 4 distal extremities, motor symmetric & bilateral 5/5 (hips: abduction, adduction, flexion; knee: flexion & extension; foot: dorsiflexion, plantar flexion, toes: dorsi flexion) Patellar & ankle reflexes intact.   Skin: Skin is warm and dry. No  rash noted. She is not diaphoretic. No erythema. No pallor.  Psychiatric: She has a normal mood and affect.    ED Course  Procedures (including critical care time)  Labs Reviewed - No data to display No results found.   No diagnosis found.    MDM  Patient with back pain.  No neurological deficits and normal neuro exam.  Patient can walk but states is painful.  No loss of bowel or bladder control.  No concern for cauda equina.  No fever, night sweats, weight loss, h/o cancer, IVDU.  RICE protocol and pain medicine indicated and discussed with patient.         Pascal Lux [place], PA-C 11/19/12 [phone]

## 2012-11-20 NOTE — ED Provider Notes (Signed)
Medical screening examination/treatment/procedure(s) were performed by non-physician practitioner and as supervising physician I was immediately available for consultation/collaboration.   Gavin Pound. Oletta Lamas, MD 11/20/12 [phone]

## 2013-01-07 ENCOUNTER — Encounter (HOSPITAL_COMMUNITY): Payer: Self-pay | Admitting: Emergency Medicine

## 2013-01-07 ENCOUNTER — Emergency Department (HOSPITAL_COMMUNITY)
Admission: EM | Admit: 2013-01-07 | Discharge: 2013-01-07 | Disposition: A | Discharge status: Home / Self Care | Payer: Self-pay | Attending: Emergency Medicine | Admitting: Emergency Medicine

## 2013-01-07 ENCOUNTER — Emergency Department (HOSPITAL_COMMUNITY): Payer: Self-pay

## 2013-01-07 DIAGNOSIS — Z79899 Other long term (current) drug therapy: Secondary | ICD-10-CM | POA: Insufficient documentation

## 2013-01-07 DIAGNOSIS — H5210 Myopia, unspecified eye: Secondary | ICD-10-CM | POA: Insufficient documentation

## 2013-01-07 DIAGNOSIS — Z862 Personal history of diseases of the blood and blood-forming organs and certain disorders involving the immune mechanism: Secondary | ICD-10-CM | POA: Insufficient documentation

## 2013-01-07 DIAGNOSIS — Z3202 Encounter for pregnancy test, result negative: Secondary | ICD-10-CM | POA: Insufficient documentation

## 2013-01-07 DIAGNOSIS — M545 Low back pain, unspecified: Secondary | ICD-10-CM | POA: Insufficient documentation

## 2013-01-07 DIAGNOSIS — J45909 Unspecified asthma, uncomplicated: Secondary | ICD-10-CM | POA: Insufficient documentation

## 2013-01-07 DIAGNOSIS — R110 Nausea: Secondary | ICD-10-CM | POA: Insufficient documentation

## 2013-01-07 DIAGNOSIS — R109 Unspecified abdominal pain: Secondary | ICD-10-CM | POA: Insufficient documentation

## 2013-01-07 DIAGNOSIS — G8929 Other chronic pain: Secondary | ICD-10-CM | POA: Insufficient documentation

## 2013-01-07 DIAGNOSIS — Z87891 Personal history of nicotine dependence: Secondary | ICD-10-CM | POA: Insufficient documentation

## 2013-01-07 DIAGNOSIS — K828 Other specified diseases of gallbladder: Secondary | ICD-10-CM | POA: Insufficient documentation

## 2013-01-07 DIAGNOSIS — M549 Dorsalgia, unspecified: Secondary | ICD-10-CM

## 2013-01-07 LAB — COMPREHENSIVE METABOLIC PANEL
AST: 10 U/L (ref 0–37)
Albumin: 3.6 g/dL (ref 3.5–5.2)
Calcium: 9.4 mg/dL (ref 8.4–10.5)
Creatinine, Ser: 0.87 mg/dL (ref 0.50–1.10)

## 2013-01-07 LAB — CBC WITH DIFFERENTIAL/PLATELET
Basophils Absolute: 0 10*3/uL (ref 0.0–0.1)
Basophils Relative: 0 % (ref 0–1)
Eosinophils Absolute: 0.2 10*3/uL (ref 0.0–0.7)
Eosinophils Relative: 3 % (ref 0–5)
HCT: 34.3 % — ABNORMAL LOW (ref 36.0–46.0)
MCH: 30.9 pg (ref 26.0–34.0)
MCHC: 33.2 g/dL (ref 30.0–36.0)
MCV: 93 fL (ref 78.0–100.0)
Monocytes Absolute: 0.6 10*3/uL (ref 0.1–1.0)
RDW: 13.7 % (ref 11.5–15.5)

## 2013-01-07 LAB — URINALYSIS, ROUTINE W REFLEX MICROSCOPIC
Hgb urine dipstick: NEGATIVE
Ketones, ur: 15 mg/dL — AB
Protein, ur: NEGATIVE mg/dL
Urobilinogen, UA: 0.2 mg/dL (ref 0.0–1.0)

## 2013-01-07 LAB — POCT PREGNANCY, URINE: Preg Test, Ur: NEGATIVE

## 2013-01-07 LAB — LIPASE, BLOOD: Lipase: 16 U/L (ref 11–59)

## 2013-01-07 MED ORDER — HYDROCODONE-ACETAMINOPHEN 5-325 MG PO TABS: 1.0000 | ORAL_TABLET | Freq: Three times a day (TID) | ORAL | Status: DC | PRN

## 2013-01-07 MED ORDER — PROMETHAZINE HCL 25 MG PO TABS: 25.0000 mg | ORAL_TABLET | Freq: Four times a day (QID) | ORAL | Status: DC | PRN

## 2013-01-07 MED ORDER — ONDANSETRON HCL 4 MG/2ML IJ SOLN
4.0000 mg | Freq: Once | INTRAMUSCULAR | Status: AC
  Administered 2013-01-07: 4 mg via INTRAVENOUS
  Filled 2013-01-07: qty 2

## 2013-01-07 MED ORDER — HYDROMORPHONE HCL PF 1 MG/ML IJ SOLN
0.5000 mg | Freq: Once | INTRAMUSCULAR | Status: AC
  Administered 2013-01-07: 0.5 mg via INTRAVENOUS
  Filled 2013-01-07: qty 1

## 2013-01-07 NOTE — ED Provider Notes (Signed)
History    This chart was scribed for Raymon Mutton, PA working with Ward Givens, MD by ED Scribe, Burman Nieves. This patient was seen in room TR07C/TR07C and the patient's care was started at 3:08 PM.   CSN: [id number]  Arrival date & time 01/07/13  1338   First MD Initiated Contact with Patient 01/07/13 1508      Chief Complaint  Patient presents with  . Back Pain    (Consider location/radiation/quality/duration/timing/severity/associated sxs/prior treatment) The history is provided by the patient. No language interpreter was used.   HPI Comments: Lindsay Roach is a 22 y.o. female who presents to the Emergency Department complaining of moderate constant left sided flank pain onset 2 days ago with associated nausea which started today. Pt states that the pain is a 9.5/10 as of now in the ED. She states that sitting down exacerbates the pain and being still seems to alleviate the pain. Pt states the pain is a pressure sensation that radiates into her frontal abdomen. Pt states she has chronic back pain but sx's she is experiencing currently are different than usual. Pt denies dizziness, numbness/tingling in lower extremities, bladder/bowel incontinence,  fever, chills, cough, nausea, vomiting, diarrhea, SOB, weakness, and any other associated symptoms.   Past Medical History  Diagnosis Date  . Asthma   . Allergy   . Anemia   . 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  No family history on file.  History  Substance Use Topics  . Smoking status: Former Smoker    Types: Cigarettes    Quit date: 05/08/2011  . Smokeless tobacco: Not on file  . Alcohol Use: No    OB History   Grav Para Term Preterm Abortions TAB SAB Ect Mult Living                  Review of Systems  Gastrointestinal: Positive for nausea and abdominal pain.  Musculoskeletal: Positive for back pain.  All other systems reviewed and are negative.    Allergies  Tramadol and  Penicillins  Home Medications   Current Outpatient Rx  Name  Route  Sig  Dispense  Refill  . albuterol (PROVENTIL HFA;VENTOLIN HFA) 108 (90 BASE) MCG/ACT inhaler   Inhalation   Inhale 2 puffs into the lungs every 6 (six) hours as needed for wheezing.         [name] HYDROcodone-acetaminophen (NORCO) 5-325 MG per tablet   Oral   Take 1 tablet by mouth every 8 (eight) hours as needed for pain.   6 tablet   0   . promethazine (PHENERGAN) 25 MG tablet   Oral   Take 1 tablet (25 mg total) by mouth every 6 (six) hours as needed for nausea.   30 tablet   0     BP 125/54  Pulse 82  Temp(Src) 98.8 F (37.1 C)  Resp 16  SpO2 99%  Physical Exam  Nursing note and vitals reviewed. Constitutional: She is oriented to person, place, and time. She appears well-developed and well-nourished. No distress.  HENT:  Head: Normocephalic and atraumatic.  Eyes: Conjunctivae and EOM are normal. Pupils are equal, round, and reactive to light.  Neck: Normal range of motion. Neck supple. No JVD present. No tracheal deviation present.  Cardiovascular: Normal rate and regular rhythm.  Exam reveals no friction rub.   No murmur heard. Pulses:      Radial pulses are 2+ on the  right side, and 2+ on the left side.       Dorsalis pedis pulses are 2+ on the right side, and 2+ on the left side.  Pulmonary/Chest: Effort normal and breath sounds normal. No respiratory distress. She has no wheezes. She has no rales.  Abdominal: Soft. Bowel sounds are normal. She exhibits no distension. There is tenderness in the epigastric area and left upper quadrant. There is CVA tenderness (left side). There is no rebound and no guarding.  Musculoskeletal: Normal range of motion. She exhibits no tenderness.       Back:  Strength 5/5 to lower extremities bilaterally. Full flexion extension of hips bilaterally. Full flexion extension and rotation of back. Tenderness to palpation of the left flank - new symptom as per  patient Tenderness to palpation of lumbosacral back - baseline for chronic back pain patient has  Lymphadenopathy:    She has no cervical adenopathy.  Neurological: She is alert and oriented to person, place, and time.  sensation intact to lower extremities intact bilaterally. Negative romberg's sign.   Skin: Skin is warm and dry.  Psychiatric: She has a normal mood and affect. Her behavior is normal.    ED Course  Procedures (including critical care time) DIAGNOSTIC STUDIES: Oxygen Saturation is 99% on room air, normal by my interpretation.    COORDINATION OF CARE:  3:39 PM Discussed ED treatment with pt and pt agrees.    Labs Reviewed  URINALYSIS, ROUTINE W REFLEX MICROSCOPIC - Abnormal; Notable for the following:    APPearance CLOUDY (*)    Bilirubin Urine SMALL (*)    Ketones, ur 15 (*)    All other components within normal limits  CBC WITH DIFFERENTIAL - Abnormal; Notable for the following:    RBC 3.69 (*)    Hemoglobin 11.4 (*)    HCT 34.3 (*)    Lymphocytes Relative 47 (*)    All other components within normal limits  COMPREHENSIVE METABOLIC PANEL  LIPASE, BLOOD  POCT PREGNANCY, URINE   Ct Abdomen Pelvis Wo Contrast  01/07/2013   *RADIOLOGY REPORT*  Clinical Data: The left flank pain, evaluate for kidney stones  CT ABDOMEN AND PELVIS WITHOUT CONTRAST  Technique:  Multidetector CT imaging of the abdomen and pelvis was performed following the standard protocol without intravenous contrast.  Comparison: Prior CT scan of the lumbar spine 05/20/2011  Findings:  Lower Chest:  The lung bases are clear.  The visualized cardiac structures within normal limits for size.  Trace pericardial fluid versus thickening anteriorly.  Unremarkable thoracic esophagus.  Abdomen: Unenhanced CT was performed per clinician order.  Lack of IV contrast limits sensitivity and specificity, especially for evaluation of abdominal/pelvic solid viscera.  Within these limitations, unremarkable CT appearance  of the stomach, duodenum, spleen, adrenal glands and pancreas.  Normal hepatic contour.  No focal hepatic lesion.  High attenuation material layering dependently within the gallbladder consistent with sludge and / or small stones.  No hydronephrosis or nephrolithiasis.  Normal-caliber large and small bowel throughout the abdomen.  No evidence of obstruction no significant colonic diverticular disease.  Normal appendix in the right lower quadrant. Unremarkable terminal ileum.  No free fluid or suspicious adenopathy.  Pelvis: 3.9 x 5.2 cm low attenuation cystic lesion in the region of the right adnexa.  There is an adjacent trace amount of free pelvic fluid in the cul-de-sac.  The uterus and left adnexa are unremarkable.  The bladder is decompressed but otherwise unremarkable.  No ureterectasis or distal ureteral  stone.  Bones: No acute fracture or aggressive appearing lytic or blastic osseous lesion.  Vascular: Limited evaluation the absence of intravenous contrast material.  No focal atherosclerotic disease or vascular abnormality.  IMPRESSION:  1.  Negative for nephrolithiasis.  2.  There is a 5.2 x 3.9 cm low attenuation cystic lesion in the region of the right adnexa.  This is almost certainly benign, but follow up ultrasound is recommended in 1 year according to the Society of Radiologists in Ultrasound 2010 Consensus Conference Statement (D Lenis Noon et al.  Management of Asymptomatic Ovarian and Other Adnexal Cysts Imaged at [place]:  Society of Radiologists in Ultrasound Consensus Conference Statement 2010.   Radiology 256 (Sept 2010):  [id number].).  3.  Gallbladder sludge versus cholelithiasis.   Original Report Authenticated By: Malachy Moan, M.D.     1. Sludge in gallbladder   2. Back pain       MDM  I personally performed the services described in this documentation, which was scribed in my presence. The recorded information has been reviewed and is accurate.  Hgb and Hct lowered - trend seen in  patient - anemia noted. Negative lipase and CMP findings. Negative findings in urine testing. CT abdomen - negative nephrolithiasis, mild sludge noted to gallbladder - cyst noted to the right adnexa. Discussed findings with Dr. Lars Mage - cleared patient for discharge - recommended discharge with pain medications and antiemetics, referral for GI, and GERD diet. Discussed findings with patient. Referred to [facility]. Discussed with patient to stay hydrated and watch diet. Discussed course of pain medications - precautions and disposal. Discussed with patient to stick with clear diet. Discussed with patient to monitor symptoms and if symptoms are to worsen or change to report back to the ED. Patient agreed to plan of care, understood, all questions answered.   Raymon Mutton, PA-C 01/08/13 [phone]

## 2013-01-07 NOTE — ED Notes (Signed)
H/a and back pain x 3 days has gotten worse otc meds not helpinghurts to void

## 2013-01-07 NOTE — ED Notes (Signed)
Pt c/o nausea, medication ordered for same

## 2013-01-08 NOTE — ED Provider Notes (Signed)
Medical screening examination/treatment/procedure(s) were performed by non-physician practitioner and as supervising physician I was immediately available for consultation/collaboration. Devoria Albe, MD, Armando Gang   Ward Givens, MD 01/08/13 1239

## 2013-01-30 ENCOUNTER — Inpatient Hospital Stay (HOSPITAL_COMMUNITY)
Admission: AD | Admit: 2013-01-30 | Discharge: 2013-01-30 | Disposition: A | Discharge status: Home / Self Care | Payer: Self-pay | Source: Ambulatory Visit | Attending: Obstetrics and Gynecology | Admitting: Obstetrics and Gynecology

## 2013-01-30 ENCOUNTER — Encounter (HOSPITAL_COMMUNITY): Payer: Self-pay | Admitting: *Deleted

## 2013-01-30 DIAGNOSIS — N760 Acute vaginitis: Secondary | ICD-10-CM | POA: Insufficient documentation

## 2013-01-30 DIAGNOSIS — N9489 Other specified conditions associated with female genital organs and menstrual cycle: Secondary | ICD-10-CM | POA: Insufficient documentation

## 2013-01-30 DIAGNOSIS — B9689 Other specified bacterial agents as the cause of diseases classified elsewhere: Secondary | ICD-10-CM | POA: Insufficient documentation

## 2013-01-30 DIAGNOSIS — A499 Bacterial infection, unspecified: Secondary | ICD-10-CM | POA: Insufficient documentation

## 2013-01-30 DIAGNOSIS — N83209 Unspecified ovarian cyst, unspecified side: Secondary | ICD-10-CM

## 2013-01-30 DIAGNOSIS — R109 Unspecified abdominal pain: Secondary | ICD-10-CM

## 2013-01-30 LAB — WET PREP, GENITAL
Trich, Wet Prep: NONE SEEN
Yeast Wet Prep HPF POC: NONE SEEN

## 2013-01-30 LAB — CBC WITH DIFFERENTIAL/PLATELET
Basophils Absolute: 0 K/uL (ref 0.0–0.1)
Basophils Relative: 0 % (ref 0–1)
Eosinophils Absolute: 0.2 K/uL (ref 0.0–0.7)
Eosinophils Relative: 3 % (ref 0–5)
HCT: 33.4 % — ABNORMAL LOW (ref 36.0–46.0)
Hemoglobin: 11 g/dL — ABNORMAL LOW (ref 12.0–15.0)
Lymphocytes Relative: 51 % — ABNORMAL HIGH (ref 12–46)
Lymphs Abs: 3.1 K/uL (ref 0.7–4.0)
MCH: 31 pg (ref 26.0–34.0)
MCHC: 32.9 g/dL (ref 30.0–36.0)
MCV: 94.1 fL (ref 78.0–100.0)
Monocytes Absolute: 0.4 K/uL (ref 0.1–1.0)
Monocytes Relative: 6 % (ref 3–12)
Neutro Abs: 2.4 K/uL (ref 1.7–7.7)
Neutrophils Relative %: 39 % — ABNORMAL LOW (ref 43–77)
Platelets: 260 K/uL (ref 150–400)
RBC: 3.55 MIL/uL — ABNORMAL LOW (ref 3.87–5.11)
RDW: 14 % (ref 11.5–15.5)
WBC: 6.1 K/uL (ref 4.0–10.5)

## 2013-01-30 LAB — URINALYSIS, ROUTINE W REFLEX MICROSCOPIC
Glucose, UA: NEGATIVE mg/dL
Leukocytes, UA: NEGATIVE
Nitrite: NEGATIVE
Protein, ur: NEGATIVE mg/dL
pH: 6 (ref 5.0–8.0)

## 2013-01-30 LAB — POCT PREGNANCY, URINE: Preg Test, Ur: NEGATIVE

## 2013-01-30 LAB — URINE MICROSCOPIC-ADD ON

## 2013-01-30 MED ORDER — METRONIDAZOLE 500 MG PO TABS: 500.0000 mg | ORAL_TABLET | Freq: Two times a day (BID) | ORAL | Status: DC

## 2013-01-30 NOTE — MAU Note (Signed)
Pt reports she was having abd pain x 1 week. Went to [facility]  On 01/07/13 and told to f/u with clinic for cyst on ovary. Stated still having abd pain especially when she urinates and has a BM.

## 2013-01-30 NOTE — MAU Provider Note (Signed)
History     CSN: [id number]  Arrival date and time: 01/30/13 1730   First Provider Initiated Contact with Patient 01/30/13 1933      Chief Complaint  Patient presents with  . Abdominal Pain   HPI Lindsay Roach is 22 y.o. presents with lower abdominal pain that radiates to her sides and she has trouble going to the bathroom for 1 year both urination and defecation.   Frequency without dysuria.  Was seen at [facility] 6/2 for same sxs.  Told it was a cyst. And told to come here for an appointment. She called the clinic and told the first opening was September so she can here for evaluation.   Does not have a doctor.  LMP 01/06/13-earlier than expected but watery type heavy.  Usually come monthly mid month.     Past Medical History  Diagnosis Date  . Asthma   . Allergy   . Anemia   . 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 No pertinent past surgical history.  History reviewed. No pertinent family history.  History  Substance Use Topics  . Smoking status: Former Smoker    Types: Cigarettes    Quit date: 05/08/2011  . Smokeless tobacco: Not on file  . Alcohol Use: No    Allergies:  Allergies  Allergen Reactions  . Tramadol Hives  . Penicillins Itching and Rash    Prescriptions prior to admission  Medication Sig Dispense Refill  . acetaminophen (TYLENOL) 500 MG tablet Take 500 mg by mouth every 6 (six) hours as needed for pain.      [name] albuterol (PROVENTIL HFA;VENTOLIN HFA) 108 (90 BASE) MCG/ACT inhaler Inhale 2 puffs into the lungs every 6 (six) hours as needed for wheezing.      . Multiple Vitamin (MULTIVITAMIN WITH MINERALS) TABS Take 1 tablet by mouth daily.        Review of Systems  Constitutional: Negative for fever and chills.  Gastrointestinal: Negative for nausea, vomiting and abdominal pain.  Genitourinary: Positive for frequency. Negative for dysuria and urgency.  Musculoskeletal: Positive for back pain.  Neurological: Negative for headaches.    Physical Exam   Blood pressure 130/76, pulse 79, temperature 98.5 F (36.9 C), temperature source Oral, resp. rate 18, height 5\' 4"  (1.626 m), weight 273 lb 3.2 oz (123.923 kg), last menstrual period 01/06/2013.  Physical Exam  Constitutional: She is oriented to person, place, and time. She appears well-developed and well-nourished. No distress.  HENT:  Head: Normocephalic.  Neck: Normal range of motion.  Cardiovascular: Normal rate.   Respiratory: Effort normal.  GI: Soft. She exhibits no distension and no mass. There is no tenderness. There is no rebound and no guarding.  Genitourinary: There is no rash, tenderness or lesion on the right labia. There is no rash, tenderness or lesion on the left labia. Enlarged: difficult to assess because of body habitus. Tender: suprapubic tenderness. Cervix exhibits no motion tenderness and no friability. Right adnexum displays no mass, no tenderness and no fullness. Left adnexum displays no mass, no tenderness and no fullness. No erythema or bleeding around the vagina. Vaginal discharge (white frothy discharge with odor) found.  Neurological: She is alert and oriented to person, place, and time.  Skin: Skin is warm and dry.  Psychiatric: She has a normal mood and affect. Her behavior is normal.   Results for orders placed during the hospital encounter of 01/30/13 (from the past 24 hour(s))  URINALYSIS, ROUTINE W  REFLEX MICROSCOPIC     Status: Abnormal   Collection Time    01/30/13  6:10 PM      Result Value Range   Color, Urine YELLOW  YELLOW   APPearance CLEAR  CLEAR   Specific Gravity, Urine >1.030 (*) 1.005 - 1.030   pH 6.0  5.0 - 8.0   Glucose, UA NEGATIVE  NEGATIVE mg/dL   Hgb urine dipstick TRACE (*) NEGATIVE   Bilirubin Urine NEGATIVE  NEGATIVE   Ketones, ur NEGATIVE  NEGATIVE mg/dL   Protein, ur NEGATIVE  NEGATIVE mg/dL   Urobilinogen, UA 1.0  0.0 - 1.0 mg/dL   Nitrite NEGATIVE  NEGATIVE   Leukocytes, UA NEGATIVE  NEGATIVE  URINE  MICROSCOPIC-ADD ON     Status: Abnormal   Collection Time    01/30/13  6:10 PM      Result Value Range   Squamous Epithelial / LPF FEW (*) RARE   RBC / HPF 0-2  <3 RBC/hpf   Urine-Other MUCOUS PRESENT    POCT PREGNANCY, URINE     Status: None   Collection Time    01/30/13  6:30 PM      Result Value Range   Preg Test, Ur NEGATIVE  NEGATIVE  WET PREP, GENITAL     Status: Abnormal   Collection Time    01/30/13  7:50 PM      Result Value Range   Yeast Wet Prep HPF POC NONE SEEN  NONE SEEN   Trich, Wet Prep NONE SEEN  NONE SEEN   Clue Cells Wet Prep HPF POC FEW (*) NONE SEEN   WBC, Wet Prep HPF POC FEW (*) NONE SEEN  CBC WITH DIFFERENTIAL     Status: Abnormal   Collection Time    01/30/13  8:15 PM      Result Value Range   WBC 6.1  4.0 - 10.5 K/uL   RBC 3.55 (*) 3.87 - 5.11 MIL/uL   Hemoglobin 11.0 (*) 12.0 - 15.0 g/dL   HCT 16.1 (*) 09.6 - 04.5 %   MCV 94.1  78.0 - 100.0 fL   MCH 31.0  26.0 - 34.0 pg   MCHC 32.9  30.0 - 36.0 g/dL   RDW 40.9  81.1 - 91.4 %   Platelets 260  150 - 400 K/uL   Neutrophils Relative % 39 (*) 43 - 77 %   Neutro Abs 2.4  1.7 - 7.7 K/uL   Lymphocytes Relative 51 (*) 12 - 46 %   Lymphs Abs 3.1  0.7 - 4.0 K/uL   Monocytes Relative 6  3 - 12 %   Monocytes Absolute 0.4  0.1 - 1.0 K/uL   Eosinophils Relative 3  0 - 5 %   Eosinophils Absolute 0.2  0.0 - 0.7 K/uL   Basophils Relative 0  0 - 1 %   Basophils Absolute 0.0  0.0 - 0.1 K/uL   MAU Course  Procedures  GC/CHL culture to lab  MDM CT scan 6/2 showed cystic lesion in the right adnexa felt to benign and f/u in 1 year.  Assessment and Plan  A:  Year long history of lower abdominal and side pain       Right cystic lesion in the adnexa seen on CT 01/07/13.  Their suggestion to followup in 1 year      Mild bacterial vaginosis       P:  Rx for flagyl to pharmacy      Referral made to [facility] for  evaluation of abdominal pain and cystic area right adnexa     May take advil prn for pain with  food  KEY,EVE M 01/30/2013, 9:02 PM

## 2013-02-03 ENCOUNTER — Emergency Department (HOSPITAL_COMMUNITY)
Admission: EM | Admit: 2013-02-03 | Discharge: 2013-02-03 | Disposition: A | Discharge status: Home / Self Care | Payer: Self-pay | Attending: Emergency Medicine | Admitting: Emergency Medicine

## 2013-02-03 ENCOUNTER — Encounter (HOSPITAL_COMMUNITY): Payer: Self-pay | Admitting: Family Medicine

## 2013-02-03 DIAGNOSIS — Z87891 Personal history of nicotine dependence: Secondary | ICD-10-CM | POA: Insufficient documentation

## 2013-02-03 DIAGNOSIS — H5789 Other specified disorders of eye and adnexa: Secondary | ICD-10-CM | POA: Insufficient documentation

## 2013-02-03 DIAGNOSIS — J45909 Unspecified asthma, uncomplicated: Secondary | ICD-10-CM | POA: Insufficient documentation

## 2013-02-03 DIAGNOSIS — Z79899 Other long term (current) drug therapy: Secondary | ICD-10-CM | POA: Insufficient documentation

## 2013-02-03 DIAGNOSIS — Z862 Personal history of diseases of the blood and blood-forming organs and certain disorders involving the immune mechanism: Secondary | ICD-10-CM | POA: Insufficient documentation

## 2013-02-03 DIAGNOSIS — H53149 Visual discomfort, unspecified: Secondary | ICD-10-CM | POA: Insufficient documentation

## 2013-02-03 DIAGNOSIS — R51 Headache: Secondary | ICD-10-CM | POA: Insufficient documentation

## 2013-02-03 MED ORDER — SODIUM CHLORIDE 0.9 % IV SOLN
1000.0000 mL | INTRAVENOUS | Status: DC
  Administered 2013-02-03: 1000 mL via INTRAVENOUS

## 2013-02-03 MED ORDER — SODIUM CHLORIDE 0.9 % IV SOLN
1000.0000 mL | Freq: Once | INTRAVENOUS | Status: AC
  Administered 2013-02-03: 1000 mL via INTRAVENOUS

## 2013-02-03 MED ORDER — METOCLOPRAMIDE HCL 5 MG/ML IJ SOLN
10.0000 mg | Freq: Once | INTRAMUSCULAR | Status: AC
  Administered 2013-02-03: 10 mg via INTRAVENOUS
  Filled 2013-02-03: qty 2

## 2013-02-03 MED ORDER — DIPHENHYDRAMINE HCL 50 MG/ML IJ SOLN
25.0000 mg | Freq: Once | INTRAMUSCULAR | Status: AC
  Administered 2013-02-03: 25 mg via INTRAVENOUS
  Filled 2013-02-03: qty 1

## 2013-02-03 MED ORDER — METOCLOPRAMIDE HCL 10 MG PO TABS: 10.0000 mg | ORAL_TABLET | Freq: Four times a day (QID) | ORAL | Status: DC | PRN

## 2013-02-03 NOTE — ED Provider Notes (Signed)
History    CSN: [id number] Arrival date & time 02/03/13  4098  First MD Initiated Contact with Patient 02/03/13 0344     Chief Complaint  Patient presents with  . Headache  . Itchy Eye   (Consider location/radiation/quality/duration/timing/severity/associated sxs/prior Treatment) Patient is a 22 y.o. female presenting with headaches. The history is provided by the patient.  Headache She is complaining of a right hemicranial headache for about the last 10 days. Headache is throbbing and worse with exposure to light. Nothing makes it any better. She did take acetaminophen which she states told the headache a little bit. She rates the pain a 10/10.there is no associated nausea vomiting. Today, she has noted some watering of her right eye and some itching from her right eye. She denies foreign body sensation she denies blurring of her vision. She denies any weakness, numbness, tingling. Past Medical History  Diagnosis Date  . Asthma   . Allergy   . Anemia   . 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 No pertinent past surgical history. No family history on file. History  Substance Use Topics  . Smoking status: Former Smoker    Types: Cigarettes    Quit date: 05/08/2011  . Smokeless tobacco: Not on file  . Alcohol Use: No   OB History   Grav Para Term Preterm Abortions TAB SAB Ect Mult Living                 Review of Systems  Neurological: Positive for headaches.  All other systems reviewed and are negative.    Allergies  Tramadol and Penicillins  Home Medications   Current Outpatient Rx  Name  Route  Sig  Dispense  Refill  . acetaminophen (TYLENOL) 500 MG tablet   Oral   Take 500 mg by mouth every 6 (six) hours as needed for pain.         [name] albuterol (PROVENTIL HFA;VENTOLIN HFA) 108 (90 BASE) MCG/ACT inhaler   Inhalation   Inhale 2 puffs into the lungs every 6 (six) hours as needed for wheezing.         . metroNIDAZOLE (FLAGYL) 500 MG tablet  Oral   Take 1 tablet (500 mg total) by mouth 2 (two) times daily.   14 tablet   0   . Multiple Vitamin (MULTIVITAMIN WITH MINERALS) TABS   Oral   Take 1 tablet by mouth daily.          BP 118/72  Pulse 85  Temp(Src) 98.7 F (37.1 C) (Oral)  Resp 17  SpO2 96%  LMP 01/06/2013 Physical Exam  Nursing note and vitals reviewed.  Morbidly obese 22 year old female, resting comfortably and in no acute distress. Vital signs are normal. Oxygen saturation is 96%, which is normal. Head is normocephalic and atraumatic. PERRLA, EOMI. Oropharynx is clear.there is no conjunctival injection. Upper lower conjunctival sacs were inspected after eversion of lids and no foreign material is seen. Anterior chambers clear. Fundi show no hemorrhage, exudate, or papilledema. She does not demonstrate any photophobia on exam. There is no palpable preauricular lymph node palpable. Neck is nontender and supple without adenopathy or JVD. Back is nontender and there is no CVA tenderness. Lungs are clear without rales, wheezes, or rhonchi. Chest is nontender. Heart has regular rate and rhythm without murmur. Abdomen is soft, flat, nontender without masses or hepatosplenomegaly and peristalsis is normoactive. Extremities have no cyanosis or edema, full range of motion is present.  Skin is warm and dry without rash. Neurologic: Mental status is normal, cranial nerves are intact, there are no motor or sensory deficits.  ED Course  Procedures (including critical care time)  1. Headache     MDM  Headache which is probably is a migraine variant. Report of eye drainage and redness not corroborated on physical exam. She'll be given headache cocktail and reassessed.  Visual acuity is symmetric. Headache is much better after IV fluids and IV metoclopramide and IV diphenhydramine. She is discharged with prescription for metoclopramide.  Dione Booze, MD 02/03/13 [phone]

## 2013-02-03 NOTE — MAU Provider Note (Signed)
Attestation of Attending Supervision of Advanced Practitioner: Evaluation and management procedures were performed by the PA/NP/CNM/OB Fellow under my supervision/collaboration. Chart reviewed and agree with management and plan.  Akya Fiorello V 02/03/2013 6:20 PM

## 2013-02-03 NOTE — ED Notes (Signed)
Pt comfortable with d/c and f/u instructions. Prescriptions x1 

## 2013-02-03 NOTE — ED Notes (Signed)
Dr. Glick at bedside.  

## 2013-02-03 NOTE — ED Notes (Addendum)
Pt reports headache onset 1 week ago, states feels like a migraine but only on right side, c/o 10/10 pain. Pt reports right eye itchiness with drainage that began yesterday morning. Pt presents with dried exudate to right eye. Fields of gaze intact, denies blurred vision at this time.

## 2013-02-11 ENCOUNTER — Emergency Department: Payer: Self-pay | Admitting: Emergency Medicine

## 2013-02-14 ENCOUNTER — Emergency Department (HOSPITAL_COMMUNITY)
Admission: EM | Admit: 2013-02-14 | Discharge: 2013-02-14 | Disposition: A | Discharge status: Home / Self Care | Payer: Self-pay | Attending: Emergency Medicine | Admitting: Emergency Medicine

## 2013-02-14 ENCOUNTER — Encounter (HOSPITAL_COMMUNITY): Payer: Self-pay | Admitting: *Deleted

## 2013-02-14 DIAGNOSIS — Z87891 Personal history of nicotine dependence: Secondary | ICD-10-CM | POA: Insufficient documentation

## 2013-02-14 DIAGNOSIS — M62830 Muscle spasm of back: Secondary | ICD-10-CM

## 2013-02-14 DIAGNOSIS — Z862 Personal history of diseases of the blood and blood-forming organs and certain disorders involving the immune mechanism: Secondary | ICD-10-CM | POA: Insufficient documentation

## 2013-02-14 DIAGNOSIS — M5380 Other specified dorsopathies, site unspecified: Secondary | ICD-10-CM | POA: Insufficient documentation

## 2013-02-14 DIAGNOSIS — J45909 Unspecified asthma, uncomplicated: Secondary | ICD-10-CM | POA: Insufficient documentation

## 2013-02-14 DIAGNOSIS — Z79899 Other long term (current) drug therapy: Secondary | ICD-10-CM | POA: Insufficient documentation

## 2013-02-14 MED ORDER — BACLOFEN 20 MG PO TABS: 20.0000 mg | ORAL_TABLET | Freq: Three times a day (TID) | ORAL | Status: DC

## 2013-02-14 MED ORDER — METHOCARBAMOL 500 MG PO TABS
500.0000 mg | ORAL_TABLET | Freq: Once | ORAL | Status: AC
  Administered 2013-02-14: 500 mg via ORAL
  Filled 2013-02-14: qty 1

## 2013-02-14 MED ORDER — CYCLOBENZAPRINE HCL 10 MG PO TABS: 10.0000 mg | ORAL_TABLET | Freq: Two times a day (BID) | ORAL | Status: DC | PRN

## 2013-02-14 NOTE — ED Provider Notes (Signed)
Medical screening examination/treatment/procedure(s) were performed by non-physician practitioner and as supervising physician I was immediately available for consultation/collaboration.   Charles B. Bernette Mayers, MD 02/14/13 1525

## 2013-02-14 NOTE — ED Notes (Signed)
Pt states that she has had intermittent back pain for 2-3 days. Pt states that "i probably slept wrong" denies any known injury. Denies any urinary symptoms.

## 2013-02-14 NOTE — ED Provider Notes (Signed)
History    CSN: [id number] Arrival date & time 02/14/13  1120  First MD Initiated Contact with Patient 02/14/13 1132     Chief Complaint  Patient presents with  . Back Pain   (Consider location/radiation/quality/duration/timing/severity/associated sxs/prior Treatment) HPI  Lindsay Roach is a 22 y.o.female with a significant PMH of asthma, allergy, anemia presents to the ER with complaints of right low back pain. She woke up 3 days ago after sleeping wrong and still has some pain. It does not radiate. It is more like a spasm. She has tried Ibuprofen and warm compresses without relief. It worsens with movement and relieves with sitting. No n,v,d, fevers. No hx of IV drug use. Denies bowel or urine incontinence. She requests note for work since she stands for + 9 hours at a time.   Past Medical History  Diagnosis Date  . Asthma   . Allergy   . Anemia   . 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 No pertinent past surgical history. No family history on file. History  Substance Use Topics  . Smoking status: Former Smoker    Types: Cigarettes    Quit date: 05/08/2011  . Smokeless tobacco: Not on file  . Alcohol Use: No   OB History   Grav Para Term Preterm Abortions TAB SAB Ect Mult Living                 Review of Systems  Musculoskeletal: Positive for back pain.  All other systems reviewed and are negative.    Allergies  Tramadol and Penicillins  Home Medications   Current Outpatient Rx  Name  Route  Sig  Dispense  Refill  . acetaminophen (TYLENOL) 500 MG tablet   Oral   Take 500 mg by mouth every 6 (six) hours as needed for pain.         [name] albuterol (PROVENTIL HFA;VENTOLIN HFA) 108 (90 BASE) MCG/ACT inhaler   Inhalation   Inhale 2 puffs into the lungs every 6 (six) hours as needed for wheezing.         . Multiple Vitamin (MULTIVITAMIN WITH MINERALS) TABS   Oral   Take 1 tablet by mouth daily.         . baclofen (LIORESAL) 20 MG tablet  Oral   Take 1 tablet (20 mg total) by mouth 3 (three) times daily.   30 each   0   . cyclobenzaprine (FLEXERIL) 10 MG tablet   Oral   Take 1 tablet (10 mg total) by mouth 2 (two) times daily as needed for muscle spasms.   20 tablet   0    BP 127/83  Pulse 80  Temp(Src) 98 F (36.7 C) (Oral)  Resp 18  SpO2 97%  LMP 01/06/2013 Physical Exam  Nursing note and vitals reviewed. Constitutional: She appears well-developed and well-nourished. No distress.  HENT:  Head: Normocephalic and atraumatic.  Eyes: Pupils are equal, round, and reactive to light.  Neck: Normal range of motion. Neck supple.  Cardiovascular: Normal rate and regular rhythm.   Pulmonary/Chest: Effort normal.  Abdominal: Soft.  Musculoskeletal:       Lumbar back: She exhibits pain and spasm.       Back:   Equal strength to bilateral lower extremities. Neurosensory function adequate to both legs. Skin color is normal. Skin is warm and moist. I see no step off deformity, no bony tenderness. Pt is able to ambulate without limp. Pain is relieved  when sitting in certain positions. ROM is decreased due to pain. No crepitus, laceration, effusion, swelling.  Pulses are normal   Neurological: She is alert.  Skin: Skin is warm and dry.    ED Course  Procedures (including critical care time) Labs Reviewed - No data to display No results found. 1. Back spasm     MDM  Patient with back pain. No neurological deficits. Patient is ambulatory. No warning symptoms of back pain including: loss of bowel or bladder control, night sweats, waking from sleep with back pain, unexplained fevers or weight loss, h/o cancer, IVDU, recent significant  trauma. No concern for cauda equina, epidural abscess, or other serious cause of back pain. Conservative measures such as rest, ice/heat and pain medicine indicated with PCP follow-up if no improvement with conservative management.    22 y.o.Lindsay Roach's evaluation in the Emergency  Department is complete. It has been determined that no acute conditions requiring further emergency intervention are present at this time. The patient/guardian have been advised of the diagnosis and plan. We have discussed signs and symptoms that warrant return to the ED, such as changes or worsening in symptoms.  Vital signs are stable at discharge. Filed Vitals:   02/14/13 1134  BP: 127/83  Pulse: 80  Temp: 98 F (36.7 C)  Resp: 18    Patient/guardian has voiced understanding and agreed to follow-up with the PCP or specialist.   Dorthula Matas, PA-C 02/14/13 1235

## 2013-03-21 ENCOUNTER — Encounter (HOSPITAL_COMMUNITY): Payer: Self-pay | Admitting: Emergency Medicine

## 2013-03-21 ENCOUNTER — Emergency Department (HOSPITAL_COMMUNITY): Payer: Self-pay

## 2013-03-21 ENCOUNTER — Emergency Department (HOSPITAL_COMMUNITY)
Admission: EM | Admit: 2013-03-21 | Discharge: 2013-03-21 | Disposition: A | Discharge status: Home / Self Care | Payer: Self-pay | Attending: Emergency Medicine | Admitting: Emergency Medicine

## 2013-03-21 DIAGNOSIS — R112 Nausea with vomiting, unspecified: Secondary | ICD-10-CM | POA: Insufficient documentation

## 2013-03-21 DIAGNOSIS — R109 Unspecified abdominal pain: Secondary | ICD-10-CM | POA: Insufficient documentation

## 2013-03-21 DIAGNOSIS — K279 Peptic ulcer, site unspecified, unspecified as acute or chronic, without hemorrhage or perforation: Secondary | ICD-10-CM

## 2013-03-21 DIAGNOSIS — Z87891 Personal history of nicotine dependence: Secondary | ICD-10-CM | POA: Insufficient documentation

## 2013-03-21 DIAGNOSIS — J45909 Unspecified asthma, uncomplicated: Secondary | ICD-10-CM | POA: Insufficient documentation

## 2013-03-21 DIAGNOSIS — Z880 Allergy status to penicillin: Secondary | ICD-10-CM | POA: Insufficient documentation

## 2013-03-21 DIAGNOSIS — Z79899 Other long term (current) drug therapy: Secondary | ICD-10-CM | POA: Insufficient documentation

## 2013-03-21 LAB — CBC WITH DIFFERENTIAL/PLATELET
Basophils Absolute: 0 10*3/uL (ref 0.0–0.1)
Basophils Relative: 0 % (ref 0–1)
Eosinophils Relative: 2 % (ref 0–5)
HCT: 36 % (ref 36.0–46.0)
MCHC: 32.8 g/dL (ref 30.0–36.0)
MCV: 94 fL (ref 78.0–100.0)
Monocytes Absolute: 0.4 10*3/uL (ref 0.1–1.0)
Neutro Abs: 2.2 10*3/uL (ref 1.7–7.7)
RDW: 13.4 % (ref 11.5–15.5)

## 2013-03-21 LAB — COMPREHENSIVE METABOLIC PANEL
AST: 10 U/L (ref 0–37)
Albumin: 3.5 g/dL (ref 3.5–5.2)
Calcium: 9.3 mg/dL (ref 8.4–10.5)
Creatinine, Ser: 0.77 mg/dL (ref 0.50–1.10)
Total Protein: 7 g/dL (ref 6.0–8.3)

## 2013-03-21 MED ORDER — ONDANSETRON HCL 4 MG/2ML IJ SOLN
4.0000 mg | Freq: Once | INTRAMUSCULAR | Status: AC
  Administered 2013-03-21: 4 mg via INTRAVENOUS
  Filled 2013-03-21: qty 2

## 2013-03-21 MED ORDER — GI COCKTAIL ~~LOC~~
30.0000 mL | Freq: Once | ORAL | Status: AC
  Administered 2013-03-21: 30 mL via ORAL
  Filled 2013-03-21: qty 30

## 2013-03-21 MED ORDER — OMEPRAZOLE 20 MG PO CPDR: 20.0000 mg | DELAYED_RELEASE_CAPSULE | Freq: Every day | ORAL | Status: DC

## 2013-03-21 MED ORDER — FAMOTIDINE IN NACL 20-0.9 MG/50ML-% IV SOLN
20.0000 mg | Freq: Once | INTRAVENOUS | Status: AC
  Administered 2013-03-21: 20 mg via INTRAVENOUS
  Filled 2013-03-21: qty 50

## 2013-03-21 MED ORDER — SODIUM CHLORIDE 0.9 % IV BOLUS (SEPSIS)
1000.0000 mL | Freq: Once | INTRAVENOUS | Status: AC
  Administered 2013-03-21: 1000 mL via INTRAVENOUS

## 2013-03-21 MED ORDER — MORPHINE SULFATE 4 MG/ML IJ SOLN
4.0000 mg | Freq: Once | INTRAMUSCULAR | Status: AC
  Administered 2013-03-21: 4 mg via INTRAVENOUS
  Filled 2013-03-21: qty 1

## 2013-03-21 MED ORDER — MORPHINE SULFATE 4 MG/ML IJ SOLN
4.0000 mg | Freq: Once | INTRAMUSCULAR | Status: AC
  Administered 2013-03-21: 4 mg via INTRAVENOUS
  Filled 2013-03-21 (×2): qty 1

## 2013-03-21 MED ORDER — ONDANSETRON 8 MG PO TBDP: 8.0000 mg | ORAL_TABLET | Freq: Three times a day (TID) | ORAL | Status: DC | PRN

## 2013-03-21 MED ORDER — HYDROCODONE-ACETAMINOPHEN 5-325 MG PO TABS: 1.0000 | ORAL_TABLET | Freq: Four times a day (QID) | ORAL | Status: DC | PRN

## 2013-03-21 NOTE — ED Notes (Signed)
Per EMS, abdominal pain since yesterday, ate [employer] and felt sick after-waiting for gallbladder surgery-Nausea

## 2013-03-21 NOTE — Progress Notes (Signed)
[facility] provided patient with a list of primary care resources.  °

## 2013-03-21 NOTE — ED Provider Notes (Addendum)
CSN: [id number]     Arrival date & time 03/21/13  0706 History     First MD Initiated Contact with Patient 03/21/13 0740     Chief Complaint  Patient presents with  . abdomial pain    (Consider location/radiation/quality/duration/timing/severity/associated sxs/prior Treatment) HPI Comments: Pt comes in w/ cc of abd pain. Has hx of asthma. Pt states that she has been having 3 days of abd pain, RUQ, constant. The pain got worse after she had a double cheese burger y'day, and really severe around 3 am. She has some nausea, no emesis. No diarrhea, passing flaus. No hx of renal stones, no hx of abd surgeries, doesn't think she is pregnant.  The history is provided by the patient.    Past Medical History  Diagnosis Date  . Asthma   . Allergy   . Anemia   . 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 No pertinent past surgical history. No family history on file. History  Substance Use Topics  . Smoking status: Former Smoker    Types: Cigarettes    Quit date: 05/08/2011  . Smokeless tobacco: Not on file  . Alcohol Use: No   OB History   Grav Para Term Preterm Abortions TAB SAB Ect Mult Living                 Review of Systems  Constitutional: Positive for activity change.  HENT: Negative for neck pain.   Respiratory: Negative for shortness of breath.   Cardiovascular: Negative for chest pain.  Gastrointestinal: Positive for nausea, vomiting and abdominal pain. Negative for diarrhea, constipation and blood in stool.  Genitourinary: Negative for dysuria.  Neurological: Negative for headaches.    Allergies  Tramadol and Penicillins  Home Medications   Current Outpatient Rx  Name  Route  Sig  Dispense  Refill  . acetaminophen (TYLENOL) 500 MG tablet   Oral   Take 500 mg by mouth every 6 (six) hours as needed for pain.         [name] albuterol (PROVENTIL HFA;VENTOLIN HFA) 108 (90 BASE) MCG/ACT inhaler   Inhalation   Inhale 2 puffs into the lungs every 6 (six)  hours as needed for wheezing.          BP 111/52  Pulse 97  Temp(Src) 98.7 F (37.1 C) (Oral)  Resp 26  Ht 5\' 4"  (1.626 m)  Wt 265 lb (120.203 kg)  BMI 45.46 kg/m2  SpO2 97% Physical Exam  Nursing note and vitals reviewed. Constitutional: She is oriented to person, place, and time. She appears well-developed and well-nourished.  HENT:  Head: Normocephalic and atraumatic.  Eyes: EOM are normal. Pupils are equal, round, and reactive to light.  Neck: Neck supple.  Cardiovascular: Normal rate, regular rhythm and normal heart sounds.   No murmur heard. Pulmonary/Chest: Effort normal. No respiratory distress.  Abdominal: Soft. She exhibits no distension. There is no tenderness. There is no rebound and no guarding.  Diffuse upper quadrant tenderness, with murphy's sign., NO CVA tenderness.  Neurological: She is alert and oriented to person, place, and time.  Skin: Skin is warm and dry.    ED Course   Procedures (including critical care time)  Labs Reviewed  CBC WITH DIFFERENTIAL - Abnormal; Notable for the following:    RBC 3.83 (*)    Hemoglobin 11.8 (*)    Neutrophils Relative % 41 (*)    Lymphocytes Relative 50 (*)    All other components within  normal limits  COMPREHENSIVE METABOLIC PANEL  URINALYSIS, ROUTINE W REFLEX MICROSCOPIC   No results found. No diagnosis found.  MDM  DDx includes: Pancreatitis Hepatobiliary pathology including cholecystitis Gastritis/PUD SBO ACS syndrome Aortic Dissection  Pt comes in with RUQ abd pain. Noted to have Murphy's, but sheis also slightly Histrionic. Tenderness is diffuse in the upper quadrants. CT from 6/14 showed some gall stones vs sludge. Suspect that this is hepatobiliary etiology vs. Duodenal ulcer. Will get [place]. Pain control for now.    Derwood Kaplan, MD 03/21/13 [id number]  US shows no gall stones. Likely gastritis. Repeat exam better. Had CT just 2 months ago, which was benign. Will d/c with GI f/u for  endoscopy.  Derwood Kaplan, MD 03/21/13 1004  Derwood Kaplan, MD 03/21/13 1018

## 2013-03-21 NOTE — ED Notes (Signed)
Patient is aware we need a urine specimen. 

## 2013-04-08 ENCOUNTER — Emergency Department (HOSPITAL_COMMUNITY)
Admission: EM | Admit: 2013-04-08 | Discharge: 2013-04-08 | Disposition: A | Discharge status: Home / Self Care | Payer: Self-pay | Attending: Emergency Medicine | Admitting: Emergency Medicine

## 2013-04-08 ENCOUNTER — Encounter (HOSPITAL_COMMUNITY): Payer: Self-pay | Admitting: Emergency Medicine

## 2013-04-08 ENCOUNTER — Emergency Department (HOSPITAL_COMMUNITY): Payer: Self-pay

## 2013-04-08 DIAGNOSIS — Z880 Allergy status to penicillin: Secondary | ICD-10-CM | POA: Insufficient documentation

## 2013-04-08 DIAGNOSIS — J45901 Unspecified asthma with (acute) exacerbation: Secondary | ICD-10-CM | POA: Insufficient documentation

## 2013-04-08 DIAGNOSIS — H5210 Myopia, unspecified eye: Secondary | ICD-10-CM | POA: Insufficient documentation

## 2013-04-08 DIAGNOSIS — Z862 Personal history of diseases of the blood and blood-forming organs and certain disorders involving the immune mechanism: Secondary | ICD-10-CM | POA: Insufficient documentation

## 2013-04-08 DIAGNOSIS — R110 Nausea: Secondary | ICD-10-CM | POA: Insufficient documentation

## 2013-04-08 DIAGNOSIS — Z87891 Personal history of nicotine dependence: Secondary | ICD-10-CM | POA: Insufficient documentation

## 2013-04-08 DIAGNOSIS — E669 Obesity, unspecified: Secondary | ICD-10-CM | POA: Insufficient documentation

## 2013-04-08 DIAGNOSIS — K219 Gastro-esophageal reflux disease without esophagitis: Secondary | ICD-10-CM | POA: Insufficient documentation

## 2013-04-08 DIAGNOSIS — Z79899 Other long term (current) drug therapy: Secondary | ICD-10-CM | POA: Insufficient documentation

## 2013-04-08 LAB — COMPREHENSIVE METABOLIC PANEL
ALT: 6 U/L (ref 0–35)
AST: 9 U/L (ref 0–37)
Albumin: 3.6 g/dL (ref 3.5–5.2)
CO2: 27 mEq/L (ref 19–32)
Calcium: 9.4 mg/dL (ref 8.4–10.5)
Creatinine, Ser: 0.74 mg/dL (ref 0.50–1.10)
GFR calc non Af Amer: >90 mL/min (ref >90)
Sodium: 137 mEq/L (ref 135–145)
Total Protein: 7.4 g/dL (ref 6.0–8.3)

## 2013-04-08 LAB — CBC WITH DIFFERENTIAL/PLATELET
Basophils Absolute: 0 10*3/uL (ref 0.0–0.1)
Basophils Relative: 0 % (ref 0–1)
Eosinophils Absolute: 0.2 10*3/uL (ref 0.0–0.7)
Eosinophils Relative: 3 % (ref 0–5)
HCT: 35.2 % — ABNORMAL LOW (ref 36.0–46.0)
MCH: 30.6 pg (ref 26.0–34.0)
MCHC: 33 g/dL (ref 30.0–36.0)
MCV: 92.9 fL (ref 78.0–100.0)
Monocytes Absolute: 0.6 10*3/uL (ref 0.1–1.0)
Platelets: 307 10*3/uL (ref 150–400)
RDW: 13.2 % (ref 11.5–15.5)
WBC: 6.8 10*3/uL (ref 4.0–10.5)

## 2013-04-08 MED ORDER — GI COCKTAIL ~~LOC~~
30.0000 mL | Freq: Once | ORAL | Status: AC
  Administered 2013-04-08: 30 mL via ORAL
  Filled 2013-04-08: qty 30

## 2013-04-08 MED ORDER — OMEPRAZOLE 20 MG PO CPDR: 40.0000 mg | DELAYED_RELEASE_CAPSULE | Freq: Every day | ORAL | Status: DC

## 2013-04-08 NOTE — ED Notes (Signed)
Patient reports that 3 weeks ago abdominal pain started and patient believes she was diagnosed with ulcers, maybe peptic ulcers, and was told to make a follow up appointment. Patient states that she is unable to schedule an appointment for another month. Patient states Hx of asthma and that her current presentation of SOB, lightheadedness, and chest pain starting last night feel similar to previous asthma flare-ups. P

## 2013-04-08 NOTE — ED Provider Notes (Signed)
Medical screening examination/treatment/procedure(s) were performed by non-physician practitioner and as supervising physician I was immediately available for consultation/collaboration.   Anette Barra L Samiha Denapoli, MD 04/08/13 2157 

## 2013-04-08 NOTE — ED Provider Notes (Signed)
CSN: [id number]     Arrival date & time 04/08/13  1626 History   First MD Initiated Contact with Patient 04/08/13 1713     Chief Complaint  Patient presents with  . Abdominal Pain  . Chest Pain  . Shortness of Breath   (Consider location/radiation/quality/duration/timing/severity/associated sxs/prior Treatment) HPI Comments: Patient presents to the emergency department with chief complaint of left upper quadrant pain. She states that she has had this pain for the past 3 weeks. She was seen approximately 3 weeks ago, and was diagnosed with ulcers. She states that she has been taking her medications as prescribed. She states that her symptoms have not changed. She states that they have not worsened, but that she wanted to be reevaluated because she is not getting any better. She also endorses a history of asthma and sleep apnea. She states that she has had some associated shortness of breath and chest pain, but that this feels similar to asthma exacerbation. She also endorses associated nausea. She denies fevers, chills, vomiting, or constipation.  The history is provided by the patient. No language interpreter was used.    Past Medical History  Diagnosis Date  . Asthma   . Allergy   . Anemia   . 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 No pertinent past surgical history. History reviewed. No pertinent family history. History  Substance Use Topics  . Smoking status: Former Smoker    Types: Cigarettes    Quit date: 05/08/2011  . Smokeless tobacco: Not on file  . Alcohol Use: No   OB History   Grav Para Term Preterm Abortions TAB SAB Ect Mult Living                 Review of Systems  All other systems reviewed and are negative.    Allergies  Tramadol and Penicillins  Home Medications   Current Outpatient Rx  Name  Route  Sig  Dispense  Refill  . acetaminophen (TYLENOL) 500 MG tablet   Oral   Take 500 mg by mouth every 6 (six) hours as needed for pain.          [name] albuterol (PROVENTIL HFA;VENTOLIN HFA) 108 (90 BASE) MCG/ACT inhaler   Inhalation   Inhale 2 puffs into the lungs every 6 (six) hours as needed for wheezing.         [name] HYDROcodone-acetaminophen (NORCO/VICODIN) 5-325 MG per tablet   Oral   Take 1 tablet by mouth every 6 (six) hours as needed for pain.   10 tablet   0   . omeprazole (PRILOSEC) 20 MG capsule   Oral   Take 1 capsule (20 mg total) by mouth daily.   30 capsule   1   . ondansetron (ZOFRAN ODT) 8 MG disintegrating tablet   Oral   Take 1 tablet (8 mg total) by mouth every 8 (eight) hours as needed for nausea.   20 tablet   0    BP 118/67  Pulse 82  Temp(Src) 98.8 F (37.1 C) (Oral)  Resp 20  SpO2 97%  LMP 04/05/2013 Physical Exam  Nursing note and vitals reviewed. Constitutional: She is oriented to person, place, and time. She appears well-developed and well-nourished.  Obese  HENT:  Head: Normocephalic and atraumatic.  Eyes: Conjunctivae and EOM are normal. Pupils are equal, round, and reactive to light.  Neck: Normal range of motion. Neck supple.  Cardiovascular: Normal rate and regular rhythm.  Exam reveals no gallop  and no friction rub.   No murmur heard. Pulmonary/Chest: Effort normal and breath sounds normal. No respiratory distress. She has no wheezes. She has no rales. She exhibits no tenderness.  Abdominal: Soft. Bowel sounds are normal. She exhibits no distension and no mass. There is tenderness. There is no rebound and no guarding.  Left upper quadrant tenderness palpation, no other focal abdominal tenderness, no fluid wave, or signs of peritonitis  Musculoskeletal: Normal range of motion. She exhibits no edema and no tenderness.  Neurological: She is alert and oriented to person, place, and time.  Skin: Skin is warm and dry.  Psychiatric: She has a normal mood and affect. Her behavior is normal. Judgment and thought content normal.    ED Course  Procedures (including critical care  time) Labs Review Labs Reviewed  CBC WITH DIFFERENTIAL  COMPREHENSIVE METABOLIC PANEL  LIPASE, BLOOD  ED ECG REPORT  I personally interpreted this EKG   Date: 04/08/2013   Rate: 80  Rhythm: normal sinus rhythm  QRS Axis: normal  Intervals: normal  ST/T Wave abnormalities: normal  Conduction Disutrbances:none  Narrative Interpretation:   Old EKG Reviewed: none available   Results for orders placed during the hospital encounter of 04/08/13  CBC WITH DIFFERENTIAL      Result Value Range   WBC 6.8  4.0 - 10.5 K/uL   RBC 3.79 (*) 3.87 - 5.11 MIL/uL   Hemoglobin 11.6 (*) 12.0 - 15.0 g/dL   HCT 16.1 (*) 09.6 - 04.5 %   MCV 92.9  78.0 - 100.0 fL   MCH 30.6  26.0 - 34.0 pg   MCHC 33.0  30.0 - 36.0 g/dL   RDW 40.9  81.1 - 91.4 %   Platelets 307  150 - 400 K/uL   Neutrophils Relative % 42 (*) 43 - 77 %   Neutro Abs 2.9  1.7 - 7.7 K/uL   Lymphocytes Relative 47 (*) 12 - 46 %   Lymphs Abs 3.2  0.7 - 4.0 K/uL   Monocytes Relative 8  3 - 12 %   Monocytes Absolute 0.6  0.1 - 1.0 K/uL   Eosinophils Relative 3  0 - 5 %   Eosinophils Absolute 0.2  0.0 - 0.7 K/uL   Basophils Relative 0  0 - 1 %   Basophils Absolute 0.0  0.0 - 0.1 K/uL  COMPREHENSIVE METABOLIC PANEL      Result Value Range   Sodium 137  135 - 145 mEq/L   Potassium 4.0  3.5 - 5.1 mEq/L   Chloride 103  96 - 112 mEq/L   CO2 27  19 - 32 mEq/L   Glucose, Bld 93  70 - 99 mg/dL   BUN 10  6 - 23 mg/dL   Creatinine, Ser 7.82  0.50 - 1.10 mg/dL   Calcium 9.4  8.4 - 95.6 mg/dL   Total Protein 7.4  6.0 - 8.3 g/dL   Albumin 3.6  3.5 - 5.2 g/dL   AST 9  0 - 37 U/L   ALT 6  0 - 35 U/L   Alkaline Phosphatase 63  39 - 117 U/L   Total Bilirubin 0.2 (*) 0.3 - 1.2 mg/dL   GFR calc non Af Amer >90  >90 mL/min   GFR calc Af Amer >90  >90 mL/min  LIPASE, BLOOD      Result Value Range   Lipase 22  11 - 59 U/L   Dg Chest 2 View  04/08/2013   *RADIOLOGY REPORT*  Clinical Data: Shortness of breath, asthma  CHEST - 2 VIEW  Comparison:   06/01/2010  Findings:  The heart size and mediastinal contours are within normal limits.  Both lungs are clear.  The visualized skeletal structures are unremarkable.  IMPRESSION: No active cardiopulmonary disease.   Original Report Authenticated By: Judie Petit. Miles Costain, M.D.   US Abdomen Complete  03/21/2013   *RADIOLOGY REPORT*  Clinical Data:  Abdominal pain  ABDOMINAL ULTRASOUND COMPLETE  Comparison:  01/07/2013  Findings:  Limited exam because of patient body habitus.  Gallbladder:  No gallstones, gallbladder wall thickening, or pericholecystic fluid.  Common Bile Duct:  Within normal limits in caliber.  Liver: No focal mass lesion identified.  Within normal limits in parenchymal echogenicity.  IVC:  Appears normal.  Pancreas:  No abnormality identified.  Spleen:  Within normal limits in size and echotexture.  Right kidney:  Normal in size and parenchymal echogenicity.  No evidence of mass or hydronephrosis.  Left kidney:  Normal in size and parenchymal echogenicity.  No evidence of mass or hydronephrosis.  Abdominal Aorta:  No aneurysm identified.  IMPRESSION: No acute finding by ultrasound.  Limited exam because of bowel gas and obese body habitus.   Original Report Authenticated By: Judie Petit. Miles Costain, M.D.      MDM   1. GERD (gastroesophageal reflux disease)    Patient with left upper quadrant pain, that is unchanged in 3 weeks. Patient was seen 3 weeks ago and was diagnosed with ulcers. She's been taking omeprazole with no relief. We'll recheck basic labs , and give GI cocktail. Will reevaluate.  Labs are reassuring. EKG is normal, chest x-ray is normal. Suspect of the patient's pain is GERD related, or possibly peptic ulcer disease. Abdomen is benign, no evidence of surgical acute abdomen. Patient had an abdominal ultrasound 2 weeks ago, which was negative, but limited by bowel gas and body habitus.  Discussed the patient with Dr. Estell Harpin, who agrees with the workup and treatment plan.  Patient is stable and ready  for discharge.  Will double up omeprazole dosing.  GI follow up is stressed.  Return precautions given.    Roxy Horseman, PA-C 04/08/13 1836

## 2013-05-27 ENCOUNTER — Emergency Department (HOSPITAL_COMMUNITY)
Admission: EM | Admit: 2013-05-27 | Discharge: 2013-05-27 | Disposition: A | Discharge status: Home / Self Care | Payer: Medicaid Other | Attending: Emergency Medicine | Admitting: Emergency Medicine

## 2013-05-27 ENCOUNTER — Emergency Department (HOSPITAL_COMMUNITY): Payer: Medicaid Other

## 2013-05-27 ENCOUNTER — Encounter (HOSPITAL_COMMUNITY): Payer: Self-pay | Admitting: Emergency Medicine

## 2013-05-27 DIAGNOSIS — R209 Unspecified disturbances of skin sensation: Secondary | ICD-10-CM | POA: Insufficient documentation

## 2013-05-27 DIAGNOSIS — Z79899 Other long term (current) drug therapy: Secondary | ICD-10-CM | POA: Insufficient documentation

## 2013-05-27 DIAGNOSIS — Z862 Personal history of diseases of the blood and blood-forming organs and certain disorders involving the immune mechanism: Secondary | ICD-10-CM | POA: Insufficient documentation

## 2013-05-27 DIAGNOSIS — Z8669 Personal history of other diseases of the nervous system and sense organs: Secondary | ICD-10-CM | POA: Insufficient documentation

## 2013-05-27 DIAGNOSIS — M25571 Pain in right ankle and joints of right foot: Secondary | ICD-10-CM

## 2013-05-27 DIAGNOSIS — J45909 Unspecified asthma, uncomplicated: Secondary | ICD-10-CM | POA: Insufficient documentation

## 2013-05-27 DIAGNOSIS — Z880 Allergy status to penicillin: Secondary | ICD-10-CM | POA: Insufficient documentation

## 2013-05-27 DIAGNOSIS — Z87891 Personal history of nicotine dependence: Secondary | ICD-10-CM | POA: Insufficient documentation

## 2013-05-27 DIAGNOSIS — Z789 Other specified health status: Secondary | ICD-10-CM | POA: Insufficient documentation

## 2013-05-27 DIAGNOSIS — M25579 Pain in unspecified ankle and joints of unspecified foot: Secondary | ICD-10-CM | POA: Insufficient documentation

## 2013-05-27 MED ORDER — MELOXICAM 15 MG PO TABS: 15.0000 mg | ORAL_TABLET | Freq: Every day | ORAL | Status: DC

## 2013-05-27 MED ORDER — HYDROCODONE-ACETAMINOPHEN 5-325 MG PO TABS: 1.0000 | ORAL_TABLET | ORAL | Status: DC | PRN

## 2013-05-27 NOTE — ED Notes (Signed)
Ortho tech paged  

## 2013-05-27 NOTE — ED Provider Notes (Signed)
CSN: [id number]     Arrival date & time 05/27/13  1222 History  This chart was scribed for non-physician practitioner Arthor Captain, PA-C working with Vida Roller, MD by Joaquin Music, ED Scribe. This patient was seen in room WTR6/WTR6 and the patient's care was started at 12:59 PM .    Chief Complaint  Patient presents with  . Foot Pain   The history is provided by the patient. No language interpreter was used.   HPI Comments: Lindsay Roach is a 22 y.o. female who presents to the Emergency Department complaining of ongoing, worsening, throbbing R foot pain onset 3 days. Pt states she was walking a short distance and noticed pain in R foot. Pt states she is in pain when walking. She states she has numbness and tingling of R foot digits. Pt states she has pain with movement of R ankle. Pt states she "has never felt this pain before." She denies any prior injuries. Pt states pain wakes her in her sleep. Pt applied ice to area with no relief. Pt denies any other injuries. Pt has asthma. Pt is otherwise healthy.   Past Medical History  Diagnosis Date  . Asthma   . Allergy   . Anemia   . 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 No pertinent past surgical history. No family history on file. History  Substance Use Topics  . Smoking status: Former Smoker    Types: Cigarettes    Quit date: 05/08/2011  . Smokeless tobacco: Not on file  . Alcohol Use: No   OB History   Grav Para Term Preterm Abortions TAB SAB Ect Mult Living                 Review of Systems  Constitutional: Positive for activity change.  Musculoskeletal: Positive for gait problem. Negative for joint swelling.  Neurological: Positive for numbness.    Allergies  Tramadol and Penicillins  Home Medications   Current Outpatient Rx  Name  Route  Sig  Dispense  Refill  . albuterol (PROVENTIL HFA;VENTOLIN HFA) 108 (90 BASE) MCG/ACT inhaler   Inhalation   Inhale 2 puffs into the lungs every  6 (six) hours as needed for wheezing.          BP 109/55  Pulse 84  Temp(Src) 98.9 F (37.2 C) (Oral)  Resp 20  SpO2 98% Physical Exam  Constitutional: She is oriented to person, place, and time. She appears well-developed and well-nourished. No distress.  HENT:  Head: Normocephalic and atraumatic.  Right Ear: External ear normal.  Left Ear: External ear normal.  Nose: Nose normal.  Eyes: Conjunctivae are normal. Pupils are equal, round, and reactive to light.  Neck: Neck supple.  Pulmonary/Chest: Effort normal.  Musculoskeletal:  Good pulses of foot. Tenderness in tendonest structure of the dorsal structure. Pain with passive flexion and extension. Pain with inversion.   Neurological: She is alert and oriented to person, place, and time.  Skin: Skin is warm and dry. She is not diaphoretic.  Psychiatric: She has a normal mood and affect.    ED Course  Procedures  DIAGNOSTIC STUDIES: Oxygen Saturation is 98% on RA, normal by my interpretation.    COORDINATION OF CARE: 1:01 PM-Discussed treatment plan which includes administering Vicodin while in ED and X-Ray. Pt agreed to plan.   Labs Review Labs Reviewed - No data to display Imaging Review No results found.  EKG Interpretation   None  MDM   1. Ankle joint pain, right    Pt X-ray negative for acute abnormality. Personally reviewed images using PACS. I suspect flexor tendinitis. Pt discharge with ASO ankle splint, icing, and ortho follow up.   .scribe    Arthor Captain, PA-C 05/27/13 1357

## 2013-05-27 NOTE — Progress Notes (Signed)
[facility] provided pt with a [facility] [employer] application.  °

## 2013-05-27 NOTE — ED Notes (Signed)
Pt c/o of right foot pain 6/10. Denies injury.

## 2013-05-31 NOTE — ED Provider Notes (Signed)
Medical screening examination/treatment/procedure(s) were performed by non-physician practitioner and as supervising physician I was immediately available for consultation/collaboration.    Vida Roller, MD 05/31/13 [phone]

## 2013-06-21 ENCOUNTER — Emergency Department (HOSPITAL_COMMUNITY): Payer: Medicaid Other

## 2013-06-21 ENCOUNTER — Encounter (HOSPITAL_COMMUNITY): Payer: Self-pay | Admitting: Emergency Medicine

## 2013-06-21 ENCOUNTER — Emergency Department (HOSPITAL_COMMUNITY)
Admission: EM | Admit: 2013-06-21 | Discharge: 2013-06-21 | Disposition: A | Discharge status: Home / Self Care | Payer: Medicaid Other | Attending: Emergency Medicine | Admitting: Emergency Medicine

## 2013-06-21 DIAGNOSIS — N83209 Unspecified ovarian cyst, unspecified side: Secondary | ICD-10-CM | POA: Insufficient documentation

## 2013-06-21 DIAGNOSIS — Z880 Allergy status to penicillin: Secondary | ICD-10-CM | POA: Insufficient documentation

## 2013-06-21 DIAGNOSIS — Z87891 Personal history of nicotine dependence: Secondary | ICD-10-CM | POA: Insufficient documentation

## 2013-06-21 DIAGNOSIS — Z862 Personal history of diseases of the blood and blood-forming organs and certain disorders involving the immune mechanism: Secondary | ICD-10-CM | POA: Insufficient documentation

## 2013-06-21 DIAGNOSIS — H5210 Myopia, unspecified eye: Secondary | ICD-10-CM | POA: Insufficient documentation

## 2013-06-21 DIAGNOSIS — Z3202 Encounter for pregnancy test, result negative: Secondary | ICD-10-CM | POA: Insufficient documentation

## 2013-06-21 DIAGNOSIS — J45909 Unspecified asthma, uncomplicated: Secondary | ICD-10-CM | POA: Insufficient documentation

## 2013-06-21 DIAGNOSIS — Z79899 Other long term (current) drug therapy: Secondary | ICD-10-CM | POA: Insufficient documentation

## 2013-06-21 LAB — CBC WITH DIFFERENTIAL/PLATELET
Basophils Absolute: 0 10*3/uL (ref 0.0–0.1)
Basophils Relative: 1 % (ref 0–1)
Eosinophils Relative: 4 % (ref 0–5)
HCT: 33.6 % — ABNORMAL LOW (ref 36.0–46.0)
MCHC: 33.9 g/dL (ref 30.0–36.0)
MCV: 92.6 fL (ref 78.0–100.0)
Monocytes Absolute: 0.4 10*3/uL (ref 0.1–1.0)
Platelets: 293 10*3/uL (ref 150–400)
RDW: 13.6 % (ref 11.5–15.5)

## 2013-06-21 LAB — COMPREHENSIVE METABOLIC PANEL
ALT: 9 U/L (ref 0–35)
Albumin: 3.6 g/dL (ref 3.5–5.2)
Alkaline Phosphatase: 67 U/L (ref 39–117)
Chloride: 103 mEq/L (ref 96–112)
GFR calc Af Amer: >90 mL/min (ref >90)
Glucose, Bld: 87 mg/dL (ref 70–99)
Potassium: 3.8 mEq/L (ref 3.5–5.1)
Sodium: 138 mEq/L (ref 135–145)
Total Bilirubin: 0.2 mg/dL — ABNORMAL LOW (ref 0.3–1.2)
Total Protein: 7.1 g/dL (ref 6.0–8.3)

## 2013-06-21 LAB — URINALYSIS, ROUTINE W REFLEX MICROSCOPIC
Ketones, ur: NEGATIVE mg/dL
Nitrite: NEGATIVE
Protein, ur: NEGATIVE mg/dL
Urobilinogen, UA: 0.2 mg/dL (ref 0.0–1.0)

## 2013-06-21 LAB — WET PREP, GENITAL
Clue Cells Wet Prep HPF POC: NONE SEEN
Trich, Wet Prep: NONE SEEN
WBC, Wet Prep HPF POC: NONE SEEN
Yeast Wet Prep HPF POC: NONE SEEN

## 2013-06-21 MED ORDER — ONDANSETRON 4 MG PO TBDP
4.0000 mg | ORAL_TABLET | Freq: Once | ORAL | Status: AC
Start: 2013-06-21 — End: 2013-06-21
  Administered 2013-06-21: 4 mg via ORAL
  Filled 2013-06-21: qty 1

## 2013-06-21 MED ORDER — ONDANSETRON HCL 4 MG/2ML IJ SOLN: 4.0000 mg | Freq: Once | INTRAMUSCULAR | Status: DC

## 2013-06-21 MED ORDER — HYDROCODONE-ACETAMINOPHEN 5-325 MG PO TABS: 1.0000 | ORAL_TABLET | Freq: Four times a day (QID) | ORAL | Status: DC | PRN

## 2013-06-21 MED ORDER — MORPHINE SULFATE 4 MG/ML IJ SOLN: 4.0000 mg | Freq: Once | INTRAMUSCULAR | Status: DC

## 2013-06-21 MED ORDER — MORPHINE SULFATE 4 MG/ML IJ SOLN
4.0000 mg | Freq: Once | INTRAMUSCULAR | Status: AC
  Administered 2013-06-21: 4 mg via INTRAMUSCULAR
  Filled 2013-06-21: qty 1

## 2013-06-21 NOTE — ED Provider Notes (Signed)
CSN: [id number]     Arrival date & time 06/21/13  0906 History   First MD Initiated Contact with Patient 06/21/13 0912     Chief Complaint  Patient presents with  . Abdominal Pain   (Consider location/radiation/quality/duration/timing/severity/associated sxs/prior Treatment) HPI Comments: Patient presents today with a chief complaint of left sided pelvic pain.  She reports that the pain has been intermittent over the past week..  She describes the pain as a sharp pain.  She has taken Ibuprofen for the pain with minimal relief.  She is currently on her menstrual cycle.  LMP was 06/18/13.  She reports that she has been diagnosed with an Ovarian cyst in the past and the pain feels similar.  She denies nausea, vomiting, diarrhea, fever, or chills.  Denies urinary symptoms.  Denies vaginal discharge.  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    The history is provided by the patient.    Past Medical History  Diagnosis Date  . Asthma   . Allergy   . Anemia   . 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 No pertinent past surgical history. No family history on file. History  Substance Use Topics  . Smoking status: Former Smoker    Types: Cigarettes    Quit date: 05/08/2011  . Smokeless tobacco: Not on file  . Alcohol Use: No   OB History   Grav Para Term Preterm Abortions TAB SAB Ect Mult Living                 Review of Systems  All other systems reviewed and are negative.    Allergies  Tramadol and Penicillins  Home Medications   Current Outpatient Rx  Name  Route  Sig  Dispense  Refill  . albuterol (PROVENTIL HFA;VENTOLIN HFA) 108 (90 BASE) MCG/ACT inhaler   Inhalation   Inhale 2 puffs into the lungs every 6 (six) hours as needed for wheezing.          BP 139/88  Temp(Src) 98.4 F (36.9 C) (Oral)  Resp 16  SpO2 99%  LMP 05/21/2013 Physical Exam  Nursing note and vitals reviewed. Constitutional: She appears well-developed and  well-nourished. No distress.  HENT:  Head: Normocephalic and atraumatic.  Mouth/Throat: Oropharynx is clear and moist.  Neck: Normal range of motion. Neck supple.  Cardiovascular: Normal rate, regular rhythm and normal heart sounds.   Pulmonary/Chest: Effort normal and breath sounds normal.  Abdominal: Soft. Bowel sounds are normal. There is no rebound and no guarding.  Genitourinary: Cervix exhibits no motion tenderness, no discharge and no friability. Right adnexum displays no mass, no tenderness and no fullness. Left adnexum displays tenderness. Left adnexum displays no mass and no fullness.  Bright red blood in the vaginal vault consistent with the patient having her menstrual period  Musculoskeletal: Normal range of motion.  Neurological: She is alert.  Skin: Skin is warm and dry. She is not diaphoretic.  Psychiatric: She has a normal mood and affect.    ED Course  Procedures (including critical care time) Labs Review Labs Reviewed  COMPREHENSIVE METABOLIC PANEL - Abnormal; Notable for the following:    Total Bilirubin 0.2 (*)    All other components within normal limits  CBC WITH DIFFERENTIAL - Abnormal; Notable for the following:    RBC 3.63 (*)    Hemoglobin 11.4 (*)    HCT 33.6 (*)    Neutrophils Relative % 36 (*)    Lymphocytes  Relative 52 (*)    All other components within normal limits  URINALYSIS, ROUTINE W REFLEX MICROSCOPIC - Abnormal; Notable for the following:    Color, Urine AMBER (*)    APPearance CLOUDY (*)    Hgb urine dipstick LARGE (*)    Bilirubin Urine SMALL (*)    Leukocytes, UA SMALL (*)    All other components within normal limits  URINE MICROSCOPIC-ADD ON  POCT PREGNANCY, URINE   Imaging Review US Transvaginal Non-ob  06/21/2013   CLINICAL DATA:  Pain.  History of ovarian cyst.  EXAM: TRANSABDOMINAL AND TRANSVAGINAL ULTRASOUND OF PELVIS  TECHNIQUE: Both transabdominal and transvaginal ultrasound examinations of the pelvis were performed.  Transabdominal technique was performed for global imaging of the pelvis including uterus, ovaries, adnexal regions, and pelvic cul-de-sac. It was necessary to proceed with endovaginal exam following the transabdominal exam to visualize the ovaries.  COMPARISON:  CT 01/07/2013.  FINDINGS: Uterus  Measurements: 7.0 x 5.9 x 6.1 cm. The uterus is retroverted. A 3.6 cm maximum diameter and. 2.1 cm maximum diameter fibroid noted in the uterus.  Endometrium  Thickness: 10 mm.  No focal abnormality visualized.  Right ovary  Measurements: 8.2 x 6.3 x 4.2 cm. Two large adjacent cysts are noted in the right ovary. These are most likely benign however there is thickening septation in the anterior most cyst. Cystic changes were noted in this region on prior CT of 01/07/2013.  Left ovary  Measurements: 5.5 x 4.0 by 3.9 cm. There is a 3.1 x 3.1 x 2.7 cm complex cyst in the left ovary.  Other findings  Trace free fluid.  IMPRESSION: 1. Two large adjacent cysts rather right ovary. Anterior most cyst has a thickened septation. These were present on prior CT of 01/07/2013. Large complex cyst left ovary. This is new. Trace free pelvic fluid. Gynecologic evaluation should be considered . 2. Retroverted fibroid uterus.   Electronically Signed   By: Maisie Fus  Register   On: 06/21/2013 13:17   US Pelvis Complete  06/21/2013   CLINICAL DATA:  Pain.  History of ovarian cyst.  EXAM: TRANSABDOMINAL AND TRANSVAGINAL ULTRASOUND OF PELVIS  TECHNIQUE: Both transabdominal and transvaginal ultrasound examinations of the pelvis were performed. Transabdominal technique was performed for global imaging of the pelvis including uterus, ovaries, adnexal regions, and pelvic cul-de-sac. It was necessary to proceed with endovaginal exam following the transabdominal exam to visualize the ovaries.  COMPARISON:  CT 01/07/2013.  FINDINGS: Uterus  Measurements: 7.0 x 5.9 x 6.1 cm. The uterus is retroverted. A 3.6 cm maximum diameter and. 2.1 cm maximum diameter  fibroid noted in the uterus.  Endometrium  Thickness: 10 mm.  No focal abnormality visualized.  Right ovary  Measurements: 8.2 x 6.3 x 4.2 cm. Two large adjacent cysts are noted in the right ovary. These are most likely benign however there is thickening septation in the anterior most cyst. Cystic changes were noted in this region on prior CT of 01/07/2013.  Left ovary  Measurements: 5.5 x 4.0 by 3.9 cm. There is a 3.1 x 3.1 x 2.7 cm complex cyst in the left ovary.  Other findings  Trace free fluid.  IMPRESSION: 1. Two large adjacent cysts rather right ovary. Anterior most cyst has a thickened septation. These were present on prior CT of 01/07/2013. Large complex cyst left ovary. This is new. Trace free pelvic fluid. Gynecologic evaluation should be considered . 2. Retroverted fibroid uterus.   Electronically Signed   By: Maisie Fus  Register  On: 06/21/2013 13:17    EKG Interpretation   None       MDM  No diagnosis found. Patient presents with a chief complaint of left sided pelvic pain.  No rebound or guarding on exam.  Patient afebrile.  Urine pregnancy negative.  Labs unremarkable.  Pelvic ultrasound shows a complex cyst of the left ovary.  Patient stable for discharge.  Patient given referral to Gynecology.  Return precautions given.    Santiago Glad, PA-C 06/21/13 [phone]

## 2013-06-21 NOTE — ED Notes (Signed)
Pt reports pain to "ovaries" since last week. Reports nausea this am. States she has cysts on her ovaries, but not sure if both or which one. Pain 9/10.

## 2013-06-21 NOTE — ED Notes (Signed)
Phlebotomy at bedside.

## 2013-06-24 NOTE — ED Provider Notes (Signed)
Medical screening examination/treatment/procedure(s) were performed by non-physician practitioner and as supervising physician I was immediately available for consultation/collaboration.  EKG Interpretation   None        Juliet Rude. Rubin Payor, MD 06/24/13 [phone]

## 2013-07-02 ENCOUNTER — Encounter (HOSPITAL_COMMUNITY): Payer: Self-pay | Admitting: Emergency Medicine

## 2013-07-02 ENCOUNTER — Emergency Department (HOSPITAL_COMMUNITY)
Admission: EM | Admit: 2013-07-02 | Discharge: 2013-07-02 | Disposition: A | Discharge status: Home / Self Care | Payer: Medicaid Other | Attending: Emergency Medicine | Admitting: Emergency Medicine

## 2013-07-02 DIAGNOSIS — Y9389 Activity, other specified: Secondary | ICD-10-CM | POA: Insufficient documentation

## 2013-07-02 DIAGNOSIS — Z862 Personal history of diseases of the blood and blood-forming organs and certain disorders involving the immune mechanism: Secondary | ICD-10-CM | POA: Insufficient documentation

## 2013-07-02 DIAGNOSIS — K2970 Gastritis, unspecified, without bleeding: Secondary | ICD-10-CM | POA: Insufficient documentation

## 2013-07-02 DIAGNOSIS — H5210 Myopia, unspecified eye: Secondary | ICD-10-CM | POA: Insufficient documentation

## 2013-07-02 DIAGNOSIS — T394X1A Poisoning by antirheumatics, not elsewhere classified, accidental (unintentional), initial encounter: Secondary | ICD-10-CM | POA: Insufficient documentation

## 2013-07-02 DIAGNOSIS — Y929 Unspecified place or not applicable: Secondary | ICD-10-CM | POA: Insufficient documentation

## 2013-07-02 DIAGNOSIS — T39314A Poisoning by propionic acid derivatives, undetermined, initial encounter: Secondary | ICD-10-CM | POA: Insufficient documentation

## 2013-07-02 DIAGNOSIS — Z87891 Personal history of nicotine dependence: Secondary | ICD-10-CM | POA: Insufficient documentation

## 2013-07-02 DIAGNOSIS — J45909 Unspecified asthma, uncomplicated: Secondary | ICD-10-CM | POA: Insufficient documentation

## 2013-07-02 DIAGNOSIS — Z79899 Other long term (current) drug therapy: Secondary | ICD-10-CM | POA: Insufficient documentation

## 2013-07-02 DIAGNOSIS — N83209 Unspecified ovarian cyst, unspecified side: Secondary | ICD-10-CM | POA: Insufficient documentation

## 2013-07-02 DIAGNOSIS — Z880 Allergy status to penicillin: Secondary | ICD-10-CM | POA: Insufficient documentation

## 2013-07-02 LAB — CBC WITH DIFFERENTIAL/PLATELET
Basophils Absolute: 0 10*3/uL (ref 0.0–0.1)
Basophils Relative: 0 % (ref 0–1)
Eosinophils Absolute: 0.3 10*3/uL (ref 0.0–0.7)
HCT: 33.9 % — ABNORMAL LOW (ref 36.0–46.0)
Hemoglobin: 11 g/dL — ABNORMAL LOW (ref 12.0–15.0)
Lymphocytes Relative: 45 % (ref 12–46)
Lymphs Abs: 2.8 10*3/uL (ref 0.7–4.0)
MCH: 30.5 pg (ref 26.0–34.0)
MCHC: 32.4 g/dL (ref 30.0–36.0)
Monocytes Absolute: 0.4 10*3/uL (ref 0.1–1.0)
Monocytes Relative: 6 % (ref 3–12)
Neutro Abs: 2.8 10*3/uL (ref 1.7–7.7)
Neutrophils Relative %: 45 % (ref 43–77)
RBC: 3.61 MIL/uL — ABNORMAL LOW (ref 3.87–5.11)
WBC: 6.3 10*3/uL (ref 4.0–10.5)

## 2013-07-02 LAB — COMPREHENSIVE METABOLIC PANEL
Albumin: 3.6 g/dL (ref 3.5–5.2)
Alkaline Phosphatase: 62 U/L (ref 39–117)
BUN: 5 mg/dL — ABNORMAL LOW (ref 6–23)
Chloride: 102 mEq/L (ref 96–112)
Creatinine, Ser: 0.73 mg/dL (ref 0.50–1.10)
GFR calc Af Amer: >90 mL/min (ref >90)
Glucose, Bld: 89 mg/dL (ref 70–99)
Sodium: 137 mEq/L (ref 135–145)
Total Bilirubin: 0.3 mg/dL (ref 0.3–1.2)

## 2013-07-02 LAB — LIPASE, BLOOD: Lipase: 15 U/L (ref 11–59)

## 2013-07-02 MED ORDER — ONDANSETRON HCL 4 MG/2ML IJ SOLN
4.0000 mg | Freq: Once | INTRAMUSCULAR | Status: DC
  Filled 2013-07-02: qty 2

## 2013-07-02 MED ORDER — GI COCKTAIL ~~LOC~~
30.0000 mL | Freq: Once | ORAL | Status: AC
  Administered 2013-07-02: 30 mL via ORAL
  Filled 2013-07-02: qty 30

## 2013-07-02 MED ORDER — HYDROCODONE-ACETAMINOPHEN 5-325 MG PO TABS: 1.0000 | ORAL_TABLET | ORAL | Status: DC | PRN

## 2013-07-02 MED ORDER — ONDANSETRON 4 MG PO TBDP
4.0000 mg | ORAL_TABLET | Freq: Once | ORAL | Status: AC
  Administered 2013-07-02: 4 mg via ORAL
  Filled 2013-07-02: qty 1

## 2013-07-02 MED ORDER — RANITIDINE HCL 150 MG PO TABS: 150.0000 mg | ORAL_TABLET | Freq: Two times a day (BID) | ORAL | Status: DC

## 2013-07-02 MED ORDER — SUCRALFATE 1 GM/10ML PO SUSP: 1.0000 g | Freq: Three times a day (TID) | ORAL | Status: DC

## 2013-07-02 NOTE — ED Provider Notes (Signed)
CSN: [id number]     Arrival date & time 07/02/13  1155 History   First MD Initiated Contact with Patient 07/02/13 1157     Chief Complaint  Patient presents with  . Abdominal Pain    LUQ  . Nausea   (Consider location/radiation/quality/duration/timing/severity/associated sxs/prior Treatment) HPI Comments: Patient presents to the ER for evaluation of left-sided abdominal and left-sided pelvic pain. Patient has an ongoing left-sided pelvic pain which was diagnosed as an ovarian cyst. This pain has not improved. She is scheduled to follow up as recommended with gynecology, appointment not until December 3.  She reports that she is now experiencing pain in left upper abdomen which is a new pain. She does admit to taking large amounts of ibuprofen for her ovarian pain. Has not been any vomiting. No hematemesis. No melena.  Patient is a 22 y.o. female presenting with abdominal pain.  Abdominal Pain   Past Medical History  Diagnosis Date  . Asthma   . Allergy   . Anemia   . 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 No family history on file. History  Substance Use Topics  . Smoking status: Former Smoker    Types: Cigarettes    Quit date: 05/08/2011  . Smokeless tobacco: Not on file  . Alcohol Use: No   OB History   Grav Para Term Preterm Abortions TAB SAB Ect Mult Living                 Review of Systems  Gastrointestinal: Positive for abdominal pain.  Genitourinary: Positive for pelvic pain.  All other systems reviewed and are negative.    Allergies  Tramadol and Penicillins  Home Medications   Current Outpatient Rx  Name  Route  Sig  Dispense  Refill  . albuterol (PROVENTIL HFA;VENTOLIN HFA) 108 (90 BASE) MCG/ACT inhaler   Inhalation   Inhale 2 puffs into the lungs every 6 (six) hours as needed for wheezing.         [name] HYDROcodone-acetaminophen (NORCO/VICODIN) 5-325 MG per tablet   Oral   Take 1-2 tablets by mouth every 6 (six) hours  as needed.   15 tablet   0    BP 128/82  Pulse 81  Temp(Src) 97.8 F (36.6 C) (Axillary)  Resp 19  SpO2 96% Physical Exam  Constitutional: She is oriented to person, place, and time. She appears well-developed and well-nourished. No distress.  HENT:  Head: Normocephalic and atraumatic.  Right Ear: Hearing normal.  Left Ear: Hearing normal.  Nose: Nose normal.  Mouth/Throat: Oropharynx is clear and moist and mucous membranes are normal.  Eyes: Conjunctivae and EOM are normal. Pupils are equal, round, and reactive to light.  Neck: Normal range of motion. Neck supple.  Cardiovascular: Regular rhythm, S1 normal and S2 normal.  Exam reveals no gallop and no friction rub.   No murmur heard. Pulmonary/Chest: Effort normal and breath sounds normal. No respiratory distress. She exhibits no tenderness.  Abdominal: Soft. Normal appearance and bowel sounds are normal. There is no hepatosplenomegaly. There is no tenderness. There is no rebound, no guarding, no tenderness at McBurney's point and negative Murphy's sign. No hernia.  Musculoskeletal: Normal range of motion.  Neurological: She is alert and oriented to person, place, and time. She has normal strength. No cranial nerve deficit or sensory deficit. Coordination normal. GCS eye subscore is 4. GCS verbal subscore is 5. GCS motor subscore is 6.  Skin: Skin is  warm, dry and intact. No rash noted. No cyanosis.  Psychiatric: She has a normal mood and affect. Her speech is normal and behavior is normal. Thought content normal.    ED Course  Procedures (including critical care time) Labs Review Labs Reviewed  CBC WITH DIFFERENTIAL  COMPREHENSIVE METABOLIC PANEL  LIPASE, BLOOD   Imaging Review No results found.  EKG Interpretation   None       MDM  Diagnosis: 1. Ovarian cyst 2. Gastritis  Patient presents to the ER for evaluation of abdominal pain. Patient has had persistent pelvic pain for some time felt to be secondary to  ovarian cyst. She has had this worked up already. Additionally, patient had recent abdominal and pelvic CAT scan with no pathology seen. Patient now experiencing pain in the left upper abdomen area with mild tenderness, no peritoneal signs. No right upper quadrant tenderness and no lower abdominal tenderness in the right lower quadrant to suggest appendicitis. She does not require any further imaging. Patient has been taking large amounts of ibuprofen for her pain. It is felt that she has a gastritis secondary to this. Renal function is normal.    Gilda Crease, MD 07/02/13 1421

## 2013-07-02 NOTE — Progress Notes (Signed)
CSW received consult for social work. CSW met with Lindsay Roach to assess Lindsay Roach current needs. Lindsay Roach requested information on shelters. Lindsay Roach stated [facility]. CSW offered shelter list and enocuraged Lindsay Roach to call [employer]. CSW also discussed other resources including the [facility] and local churches to assist with Lindsay Roach financial needs. Lindsay Roach thanked csw for concern and support. Lindsay Roach stated she had no further needs.   Catha Gosselin, LCSW [phone]  ED CSW .07/02/2013 1443pm

## 2013-07-02 NOTE — Progress Notes (Signed)
[facility] CL spoke with patient about the [facility] [employer]. Patient stated that she was in the process of getting [employer] with [employer]. Pt. Stated that she spoke with Nurse Dottie at [facility] and was told that she needed to get a form from [employer]. Patient did not know what type of form or where to get it. Explained information pt needed to retrieve for [facility], provided her with address to the [employer], and directions. Also, provided contact information for the [facility].

## 2013-07-02 NOTE — ED Notes (Signed)
Per [facility]- Pt was at bus stop this am. Attempting to get on a bus and called [phone].  Pt c/o of LUQ pain that started at 8am. C/o of nausea only. Report reports she has ovarian cancer.

## 2013-07-02 NOTE — ED Notes (Signed)
Bed: WA06 Expected date:  Expected time:  Means of arrival:  Comments: EMS-abdominal pain 

## 2013-07-10 ENCOUNTER — Encounter: Payer: Self-pay | Admitting: Obstetrics & Gynecology

## 2013-09-03 ENCOUNTER — Encounter (HOSPITAL_COMMUNITY): Payer: Self-pay | Admitting: Emergency Medicine

## 2013-09-03 ENCOUNTER — Emergency Department (HOSPITAL_COMMUNITY)
Admission: EM | Admit: 2013-09-03 | Discharge: 2013-09-03 | Disposition: A | Discharge status: Home / Self Care | Payer: Medicaid Other | Attending: Emergency Medicine | Admitting: Emergency Medicine

## 2013-09-03 DIAGNOSIS — Z79899 Other long term (current) drug therapy: Secondary | ICD-10-CM | POA: Insufficient documentation

## 2013-09-03 DIAGNOSIS — J45909 Unspecified asthma, uncomplicated: Secondary | ICD-10-CM | POA: Insufficient documentation

## 2013-09-03 DIAGNOSIS — Z862 Personal history of diseases of the blood and blood-forming organs and certain disorders involving the immune mechanism: Secondary | ICD-10-CM | POA: Insufficient documentation

## 2013-09-03 DIAGNOSIS — Z8669 Personal history of other diseases of the nervous system and sense organs: Secondary | ICD-10-CM | POA: Insufficient documentation

## 2013-09-03 DIAGNOSIS — K0381 Cracked tooth: Secondary | ICD-10-CM | POA: Insufficient documentation

## 2013-09-03 DIAGNOSIS — K002 Abnormalities of size and form of teeth: Secondary | ICD-10-CM | POA: Insufficient documentation

## 2013-09-03 DIAGNOSIS — Z880 Allergy status to penicillin: Secondary | ICD-10-CM | POA: Insufficient documentation

## 2013-09-03 DIAGNOSIS — K089 Disorder of teeth and supporting structures, unspecified: Secondary | ICD-10-CM | POA: Insufficient documentation

## 2013-09-03 DIAGNOSIS — K0889 Other specified disorders of teeth and supporting structures: Secondary | ICD-10-CM

## 2013-09-03 DIAGNOSIS — Z87891 Personal history of nicotine dependence: Secondary | ICD-10-CM | POA: Insufficient documentation

## 2013-09-03 MED ORDER — NAPROXEN 250 MG PO TABS
500.0000 mg | ORAL_TABLET | Freq: Once | ORAL | Status: AC
  Administered 2013-09-03: 500 mg via ORAL
  Filled 2013-09-03: qty 2

## 2013-09-03 MED ORDER — CLINDAMYCIN HCL 150 MG PO CAPS: 300.0000 mg | ORAL_CAPSULE | Freq: Three times a day (TID) | ORAL | Status: DC

## 2013-09-03 MED ORDER — NAPROXEN 500 MG PO TABS: 500.0000 mg | ORAL_TABLET | Freq: Two times a day (BID) | ORAL | Status: DC

## 2013-09-03 NOTE — Discharge Instructions (Signed)
Take Clindamycin as directed until gone. Take Naprosyn as needed for pain. Refer to attached documents for more information.

## 2013-09-03 NOTE — ED Provider Notes (Signed)
CSN: [id number]     Arrival date & time 09/03/13  1225 History  This chart was scribed for non-physician practitioner Emilia BeckKaitlyn Rachelanne Whidby, PA-C, working with Shon Batonourtney F Horton, MD, by Yevette EdwardsAngela Bracken, ED Scribe. This patient was seen in room TR05C/TR05C and the patient's care was started at 1:41 PM.  First MD Initiated Contact with Patient 09/03/13 1309     Chief Complaint  Patient presents with  . Dental Pain    Patient is a 23 y.o. female presenting with tooth pain. The history is provided by the patient. No language interpreter was used.  Dental Pain Associated symptoms: no fever    HPI Comments: Lindsay Roach is a 23 y.o. female who presents to the Emergency Department complaining of right upper dental pain and left lower dental pain which has been occurring for three days. The pt reports she may have broken her tooth. She states the pain has prevented her from eating normally. The pt has used IBU once for pain without relief. Ms. [name] is a former smoker.   Dr. Wilber BihariSharon Stone is her dentist; the pt has not contacted her dentist.   Past Medical History  Diagnosis Date  . Asthma   . Allergy   . Anemia   . 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 No pertinent past surgical history. History reviewed. No pertinent family history. History  Substance Use Topics  . Smoking status: Former Smoker    Types: Cigarettes    Quit date: 05/08/2011  . Smokeless tobacco: Not on file  . Alcohol Use: No   No OB history provided.  Review of Systems  Constitutional: Negative for fever.  HENT: Positive for dental problem.   All other systems reviewed and are negative.    Allergies  Tramadol and Penicillins  Home Medications   Current Outpatient Rx  Name  Route  Sig  Dispense  Refill  . albuterol (PROVENTIL HFA;VENTOLIN HFA) 108 (90 BASE) MCG/ACT inhaler   Inhalation   Inhale 2 puffs into the lungs every 6 (six) hours as needed for wheezing.         [name].  HYDROcodone-acetaminophen (NORCO/VICODIN) 5-325 MG per tablet   Oral   Take 1-2 tablets by mouth every 6 (six) hours as needed.   15 tablet   0   . HYDROcodone-acetaminophen (NORCO/VICODIN) 5-325 MG per tablet   Oral   Take 1-2 tablets by mouth every 4 (four) hours as needed.   20 tablet   0   . ibuprofen (ADVIL,MOTRIN) 200 MG tablet   Oral   Take 400 mg by mouth every 6 (six) hours as needed.         . ranitidine (ZANTAC) 150 MG tablet   Oral   Take 1 tablet (150 mg total) by mouth 2 (two) times daily.   60 tablet   0   . sucralfate (CARAFATE) 1 GM/10ML suspension   Oral   Take 10 mLs (1 g total) by mouth 4 (four) times daily -  with meals and at bedtime.   420 mL   0    Triage Vitals: BP 138/82  Pulse 75  Temp(Src) 97.6 F (36.4 C) (Oral)  Resp 18  Ht 5\' 5"  (1.651 m)  Wt 271 lb (122.925 kg)  BMI 45.10 kg/m2  SpO2 99%  LMP 08/10/2013  Physical Exam  Nursing note and vitals reviewed. Constitutional: She is oriented to person, place, and time. She appears well-developed and well-nourished. No distress.  HENT:  Head: Normocephalic and atraumatic.  Poor dentition. Right upper posterior molar and left lower posterior molar cracked and tender to percussion.   Eyes: EOM are normal.  Neck: Neck supple. No tracheal deviation present.  Cardiovascular: Normal rate.   Pulmonary/Chest: Effort normal. No respiratory distress.  Musculoskeletal: Normal range of motion.  Neurological: She is alert and oriented to person, place, and time.  Skin: Skin is warm and dry.  Psychiatric: She has a normal mood and affect. Her behavior is normal.    ED Course  Procedures (including critical care time)  DIAGNOSTIC STUDIES: Oxygen Saturation is 99% on room air, normal by my interpretation.    COORDINATION OF CARE:  1:43 PM- Discussed treatment plan with patient, which includes pain medication and antibiotics, and the patient agreed to the plan.   Labs Review Labs Reviewed -  No data to display Imaging Review No results found.  EKG Interpretation   None       MDM   1. Pain, dental    1:50 PM Patient will have Clindamycin and naprosyn for symptoms. Patient advised to follow up with her dentist. Vitals stable and patient afebrile.   I personally performed the services described in this documentation, which was scribed in my presence. The recorded information has been reviewed and is accurate.     Emilia Beck, PA-C 09/03/13 1351

## 2013-09-03 NOTE — ED Notes (Signed)
Pt c/o Right lower tooth pain and Left upper tooth pain x3 days, pt states she may have broken a tooth

## 2013-09-03 NOTE — ED Provider Notes (Signed)
Medical screening examination/treatment/procedure(s) were performed by non-physician practitioner and as supervising physician I was immediately available for consultation/collaboration.  EKG Interpretation   None        Courtney F Horton, MD 09/03/13 1930 

## 2013-10-01 ENCOUNTER — Encounter (HOSPITAL_COMMUNITY): Payer: Self-pay | Admitting: Emergency Medicine

## 2013-10-01 ENCOUNTER — Emergency Department (HOSPITAL_COMMUNITY): Payer: Medicaid Other

## 2013-10-01 ENCOUNTER — Emergency Department (HOSPITAL_COMMUNITY)
Admission: EM | Admit: 2013-10-01 | Discharge: 2013-10-01 | Disposition: A | Discharge status: Home / Self Care | Payer: Medicaid Other | Attending: Emergency Medicine | Admitting: Emergency Medicine

## 2013-10-01 DIAGNOSIS — S99919A Unspecified injury of unspecified ankle, initial encounter: Principal | ICD-10-CM

## 2013-10-01 DIAGNOSIS — J45909 Unspecified asthma, uncomplicated: Secondary | ICD-10-CM | POA: Insufficient documentation

## 2013-10-01 DIAGNOSIS — Z87891 Personal history of nicotine dependence: Secondary | ICD-10-CM | POA: Insufficient documentation

## 2013-10-01 DIAGNOSIS — Z79899 Other long term (current) drug therapy: Secondary | ICD-10-CM | POA: Insufficient documentation

## 2013-10-01 DIAGNOSIS — Y929 Unspecified place or not applicable: Secondary | ICD-10-CM | POA: Insufficient documentation

## 2013-10-01 DIAGNOSIS — Z792 Long term (current) use of antibiotics: Secondary | ICD-10-CM | POA: Insufficient documentation

## 2013-10-01 DIAGNOSIS — Y939 Activity, unspecified: Secondary | ICD-10-CM | POA: Insufficient documentation

## 2013-10-01 DIAGNOSIS — Z8669 Personal history of other diseases of the nervous system and sense organs: Secondary | ICD-10-CM | POA: Insufficient documentation

## 2013-10-01 DIAGNOSIS — W010XXA Fall on same level from slipping, tripping and stumbling without subsequent striking against object, initial encounter: Secondary | ICD-10-CM | POA: Insufficient documentation

## 2013-10-01 DIAGNOSIS — S8990XA Unspecified injury of unspecified lower leg, initial encounter: Secondary | ICD-10-CM | POA: Insufficient documentation

## 2013-10-01 DIAGNOSIS — Z862 Personal history of diseases of the blood and blood-forming organs and certain disorders involving the immune mechanism: Secondary | ICD-10-CM | POA: Insufficient documentation

## 2013-10-01 DIAGNOSIS — M25569 Pain in unspecified knee: Secondary | ICD-10-CM

## 2013-10-01 DIAGNOSIS — Z880 Allergy status to penicillin: Secondary | ICD-10-CM | POA: Insufficient documentation

## 2013-10-01 DIAGNOSIS — S99929A Unspecified injury of unspecified foot, initial encounter: Principal | ICD-10-CM

## 2013-10-01 NOTE — Discharge Instructions (Signed)
Arthralgia  Your caregiver has diagnosed you as suffering from an arthralgia. Arthralgia means there is pain in a joint. This can come from many reasons including:  · Bruising the joint which causes soreness (inflammation) in the joint.  · Wear and tear on the joints which occur as we grow older (osteoarthritis).  · Overusing the joint.  · Various forms of arthritis.  · Infections of the joint.  Regardless of the cause of pain in your joint, most of these different pains respond to anti-inflammatory drugs and rest. The exception to this is when a joint is infected, and these cases are treated with antibiotics, if it is a bacterial infection.  HOME CARE INSTRUCTIONS   · Rest the injured area for as long as directed by your caregiver. Then slowly start using the joint as directed by your caregiver and as the pain allows. Crutches as directed may be useful if the ankles, knees or hips are involved. If the knee was splinted or casted, continue use and care as directed. If an stretchy or elastic wrapping bandage has been applied today, it should be removed and re-applied every 3 to 4 hours. It should not be applied tightly, but firmly enough to keep swelling down. Watch toes and feet for swelling, bluish discoloration, coldness, numbness or excessive pain. If any of these problems (symptoms) occur, remove the ace bandage and re-apply more loosely. If these symptoms persist, contact your caregiver or return to this location.  · For the first 24 hours, keep the injured extremity elevated on pillows while lying down.  · Apply ice for 15-20 minutes to the sore joint every couple hours while awake for the first half day. Then 03-04 times per day for the first 48 hours. Put the ice in a plastic bag and place a towel between the bag of ice and your skin.  · Wear any splinting, casting, elastic bandage applications, or slings as instructed.  · Only take over-the-counter or prescription medicines for pain, discomfort, or fever as  directed by your caregiver. Do not use aspirin immediately after the injury unless instructed by your physician. Aspirin can cause increased bleeding and bruising of the tissues.  · If you were given crutches, continue to use them as instructed and do not resume weight bearing on the sore joint until instructed.  Persistent pain and inability to use the sore joint as directed for more than 2 to 3 days are warning signs indicating that you should see a caregiver for a follow-up visit as soon as possible. Initially, a hairline fracture (break in bone) may not be evident on X-rays. Persistent pain and swelling indicate that further evaluation, non-weight bearing or use of the joint (use of crutches or slings as instructed), or further X-rays are indicated. X-rays may sometimes not show a small fracture until a week or 10 days later. Make a follow-up appointment with your own caregiver or one to whom we have referred you. A radiologist (specialist in reading X-rays) may read your X-rays. Make sure you know how you are to obtain your X-ray results. Do not assume everything is normal if you do not hear from us.  SEEK MEDICAL CARE IF:  Bruising, swelling, or pain increases.  SEEK IMMEDIATE MEDICAL CARE IF:   · Your fingers or toes are numb or blue.  · The pain is not responding to medications and continues to stay the same or get worse.  · The pain in your joint becomes severe.  · You   develop a fever over 102° F (38.9° C).  · It becomes impossible to move or use the joint.  MAKE SURE YOU:   · Understand these instructions.  · Will watch your condition.  · Will get help right away if you are not doing well or get worse.  Document Released: 07/25/2005 Document Revised: 10/17/2011 Document Reviewed: 03/12/2008  [employer]® Patient Information ©2014 [employer].

## 2013-10-01 NOTE — ED Notes (Signed)
Pt states she fell on stairs today hitting right knee, c/o right knee pain.

## 2013-10-01 NOTE — ED Provider Notes (Signed)
CSN: [id number]     Arrival date & time 10/01/13  1621 History  This chart was scribed for non-physician practitioner, Roxy Horsemanobert Norleen Xie, PA-C working with Audree CamelScott T Goldston, MD by Greggory StallionKayla Andersen, ED scribe. This patient was seen in room WTR6/WTR6 and the patient's care was started at 5:16 PM.    Chief Complaint  Patient presents with  . Fall  . Knee Pain   The history is provided by the patient. No language interpreter was used.   HPI Comments: Lindsay Roach is a 23 y.o. female who presents to the Emergency Department complaining of a fall that occurred earlier today. She states she slipped on ice and fell onto her knee while it was bent. She has sudden onset right knee pain. Denies hitting her head or LOC. Rates the pain 9/10. Bearing weight and bending her knee worsen the pain.   Past Medical History  Diagnosis Date  . Asthma   . Allergy   . Anemia   . 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 No pertinent past surgical history. No family history on file. History  Substance Use Topics  . Smoking status: Former Smoker    Types: Cigarettes    Quit date: 05/08/2011  . Smokeless tobacco: Not on file  . Alcohol Use: No   OB History   Grav Para Term Preterm Abortions TAB SAB Ect Mult Living                 Review of Systems  Constitutional: Negative for fever.  HENT: Negative for congestion.   Eyes: Negative for redness.  Respiratory: Negative for shortness of breath.   Cardiovascular: Negative for chest pain.  Gastrointestinal: Negative for abdominal pain.  Musculoskeletal: Positive for arthralgias.  Skin: Negative for rash.  Neurological: Negative for speech difficulty.  Psychiatric/Behavioral: Negative for confusion.   Allergies  Tramadol and Penicillins  Home Medications   Current Outpatient Rx  Name  Route  Sig  Dispense  Refill  . albuterol (PROVENTIL HFA;VENTOLIN HFA) 108 (90 BASE) MCG/ACT inhaler   Inhalation   Inhale 2 puffs into the lungs every 6  (six) hours as needed for wheezing.         . clindamycin (CLEOCIN) 150 MG capsule   Oral   Take 2 capsules (300 mg total) by mouth 3 (three) times daily. May dispense as 150mg  capsules   60 capsule   0    BP 122/81  Pulse 77  Temp(Src) 98.2 F (36.8 C) (Oral)  Resp 20  Ht 5\' 4"  (1.626 m)  Wt 271 lb (122.925 kg)  BMI 46.49 kg/m2  SpO2 100%  LMP 09/30/2013  Physical Exam  Nursing note and vitals reviewed. Constitutional: She is oriented to person, place, and time. She appears well-developed and well-nourished. No distress.  HENT:  Head: Normocephalic and atraumatic.  Eyes: EOM are normal.  Neck: Neck supple. No tracheal deviation present.  Cardiovascular: Normal rate.   Pulmonary/Chest: Effort normal. No respiratory distress.  Musculoskeletal: Normal range of motion.  Right knee mildly tender to palpation. No bony abnormality or deformity. No ecchymosis.   Neurological: She is alert and oriented to person, place, and time.  Skin: Skin is warm and dry.  Psychiatric: She has a normal mood and affect. Her behavior is normal.    ED Course  Procedures (including critical care time)  DIAGNOSTIC STUDIES: Oxygen Saturation is 100% on RA, normal by my interpretation.    COORDINATION OF CARE: 5:18 PM-Discussed  treatment plan which includes a knee sleeve and tylenol or ibuprofen with pt at bedside and pt agreed to plan.   Labs Review Labs Reviewed - No data to display Imaging Review Dg Knee Complete 4 Views Right  10/01/2013   CLINICAL DATA:  Fall.  Knee pain.  Patellar pain.  EXAM: RIGHT KNEE - COMPLETE 4+ VIEW  COMPARISON:  None.  FINDINGS: Anatomic alignment of the right knee. There is no fracture present. No joint effusion. Prepatellar soft tissues appear normal.  IMPRESSION: Negative.   Electronically Signed   By: Andreas Newport M.D.   On: 10/01/2013 16:58    EKG Interpretation   None       MDM   Final diagnoses:  Knee pain    Mechanical fall on knee.  No  fracture.  Ambulatory.  PCP follow-up.  I personally performed the services described in this documentation, which was scribed in my presence. The recorded information has been reviewed and is accurate.    Roxy Horseman, PA-C 10/01/13 1728

## 2013-10-01 NOTE — ED Notes (Signed)
Pt ambulatory to exam room with steady gait.  

## 2013-10-02 NOTE — ED Provider Notes (Signed)
Medical screening examination/treatment/procedure(s) were performed by non-physician practitioner and as supervising physician I was immediately available for consultation/collaboration.  EKG Interpretation   None         Audree CamelScott T Mariam Helbert, MD 10/02/13 0003

## 2014-04-12 IMAGING — CR DG CHEST 2V
2 series · 2 of 2 positions shown · non-contrast
Comparison: 06/01/2010

CLINICAL DATA: Shortness of breath, asthma

CHEST - 2 VIEW

[w chest pa]
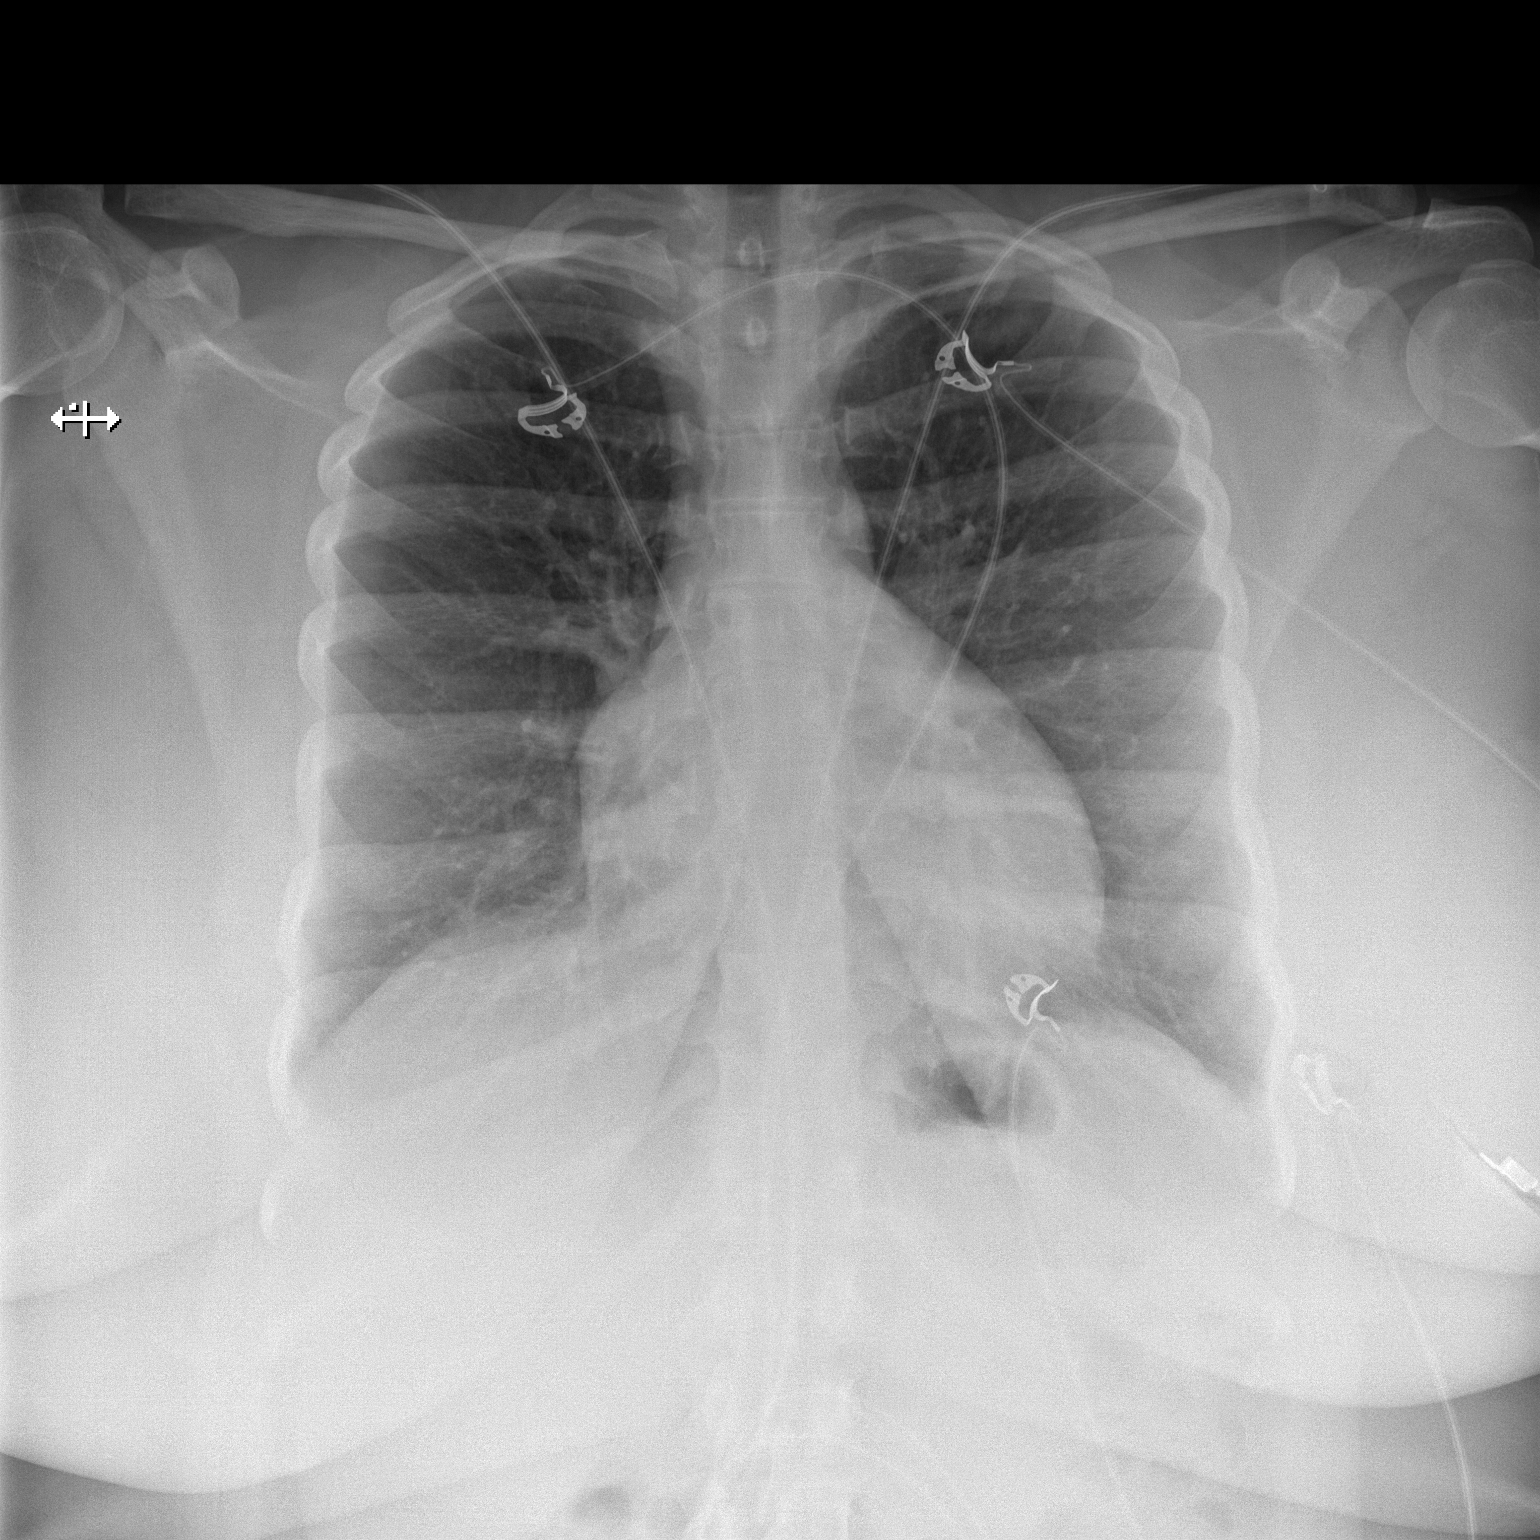

[w chest lat]
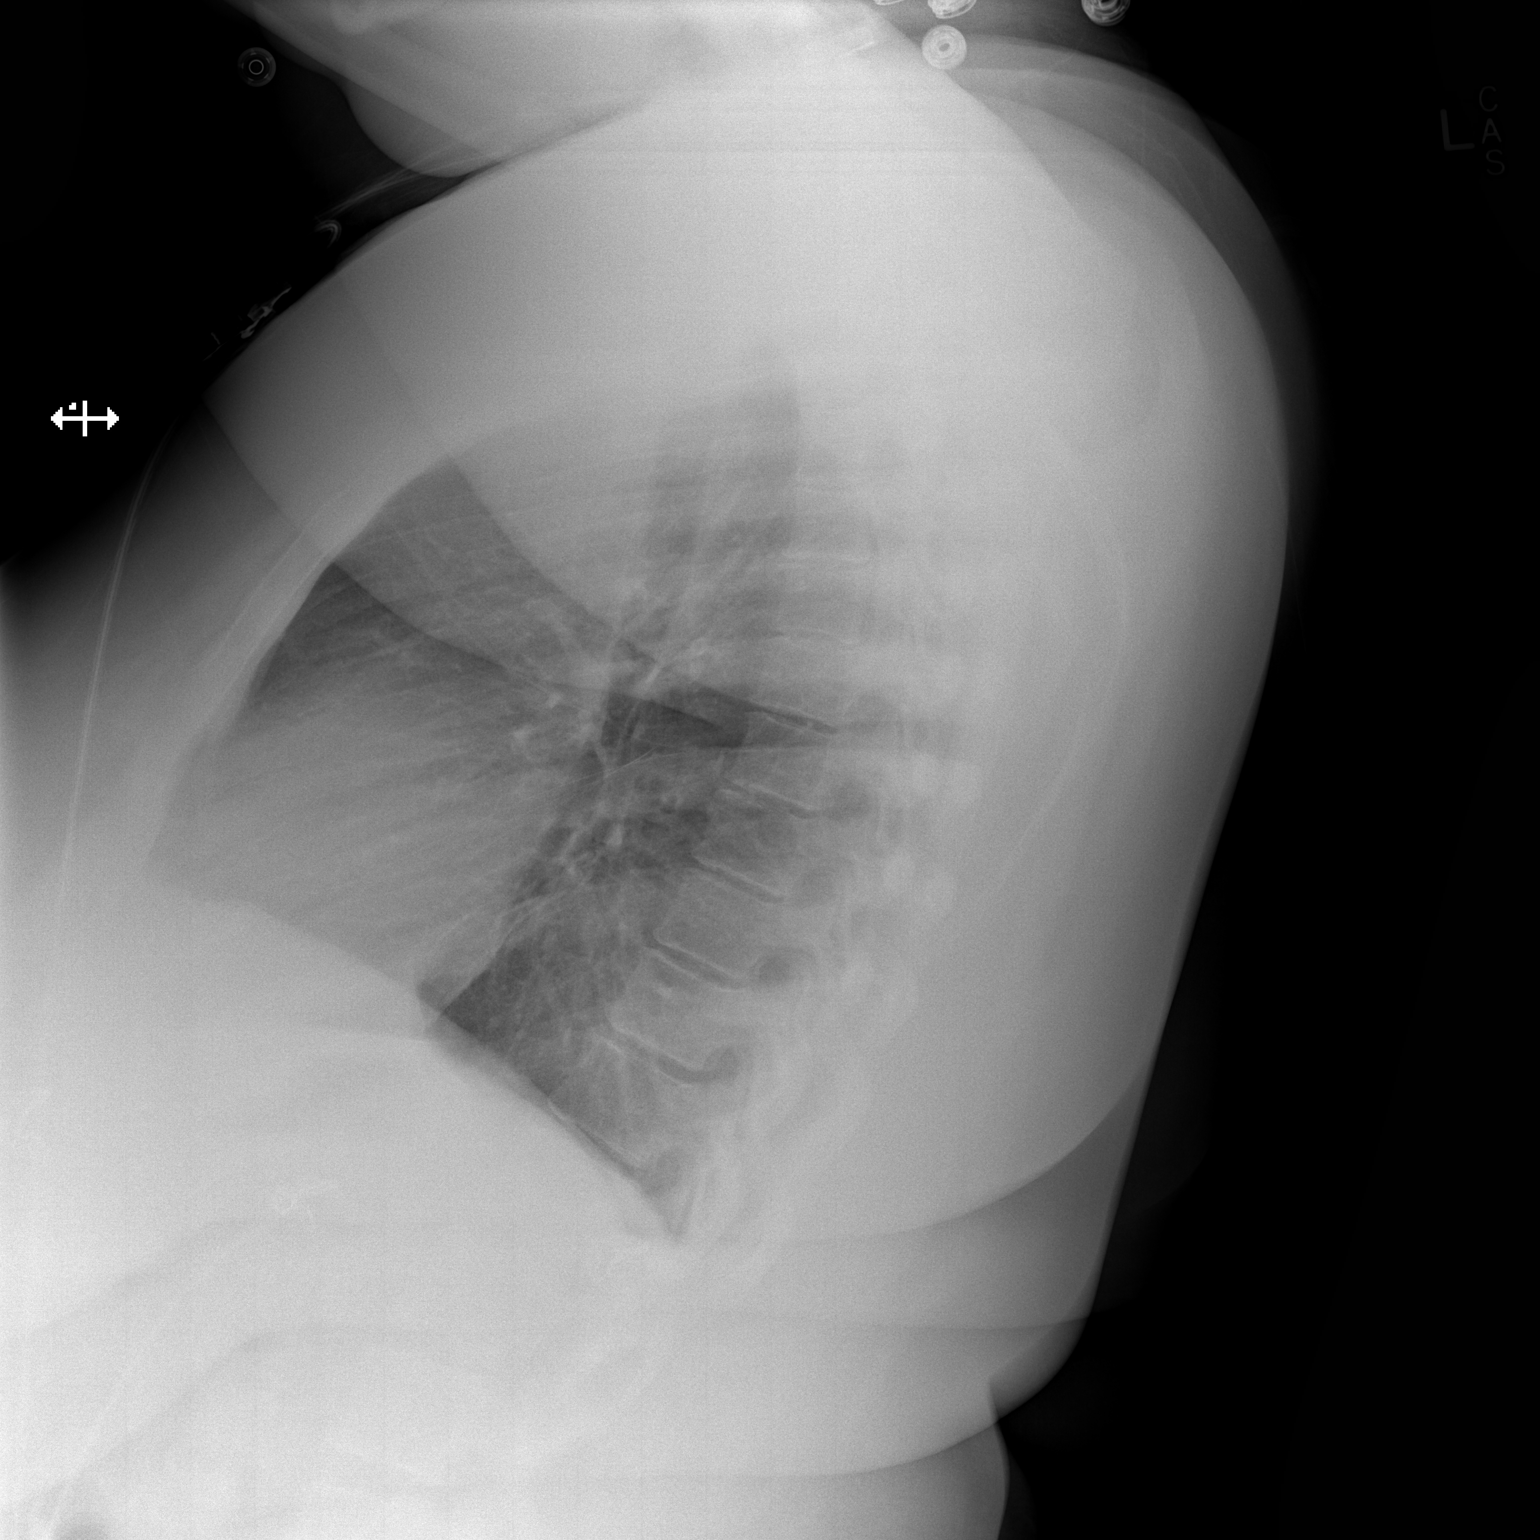

[2 of 2 positions shown; findings below may reference images not displayed]

FINDINGS: The heart size and mediastinal contours are within
normal limits.  Both lungs are clear.  The visualized skeletal
structures are unremarkable.
IMPRESSION: No active cardiopulmonary disease.

## 2014-06-25 IMAGING — US US TRANSVAGINAL NON-OB
1 series · 13 of 25 positions shown · non-contrast
Comparison: CT 01/07/2013.

CLINICAL DATA: Pain.  History of ovarian cyst.



[Series 1: us transvaginal non-ob · 0.27mm/px · 13 of 95 slices shown]
[im 1/95]
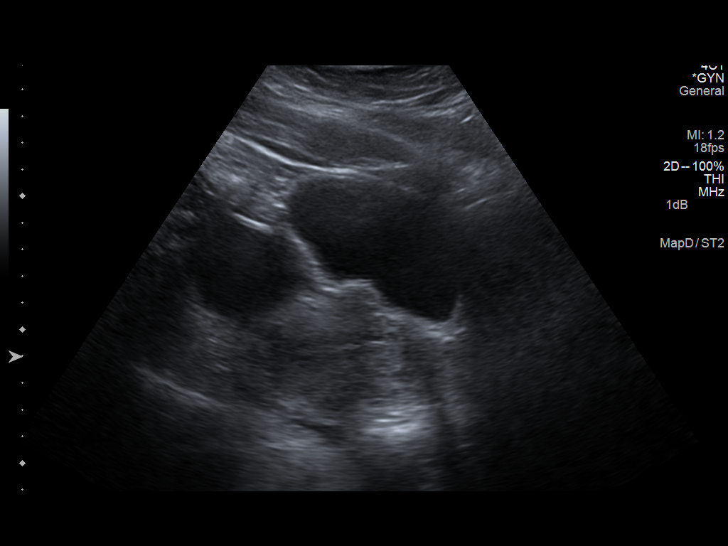
[im 8/95]
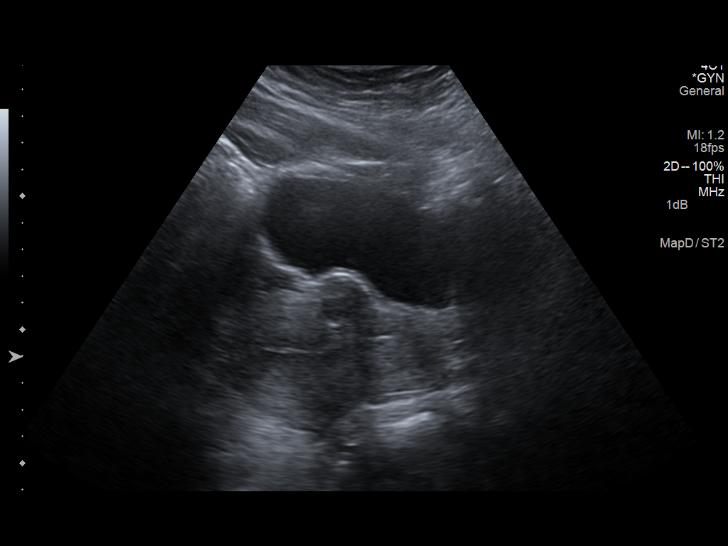
[im 16/95]
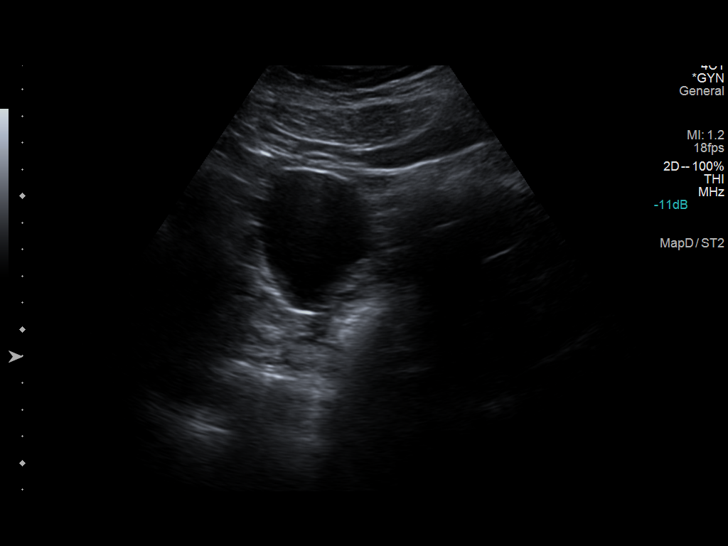
[im 24/95]
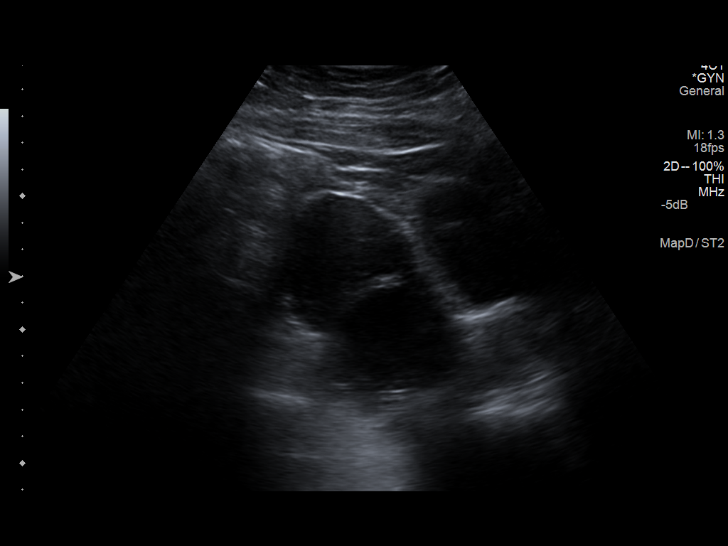
[im 32/95]
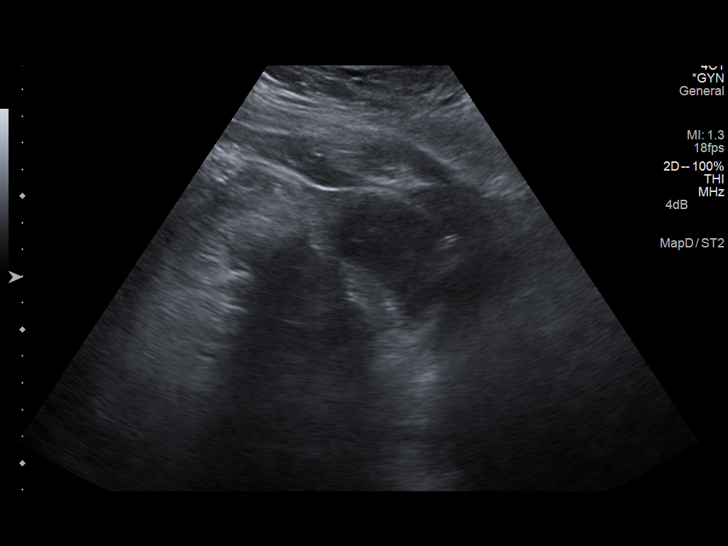
[im 40/95]
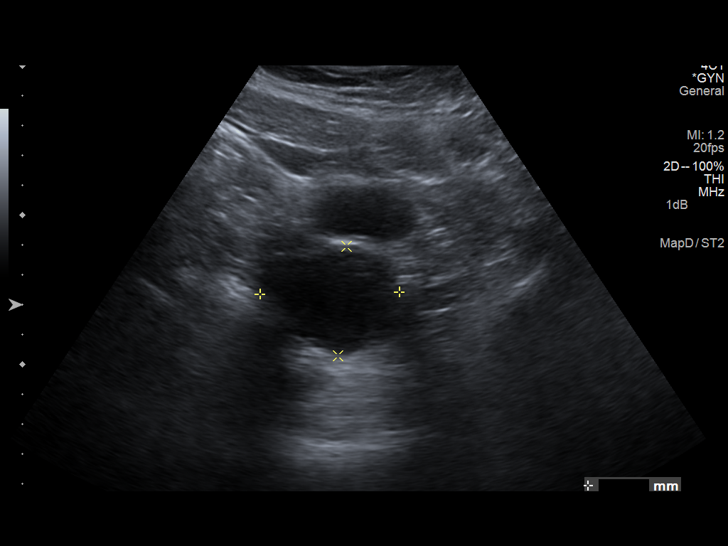
[im 48/95]
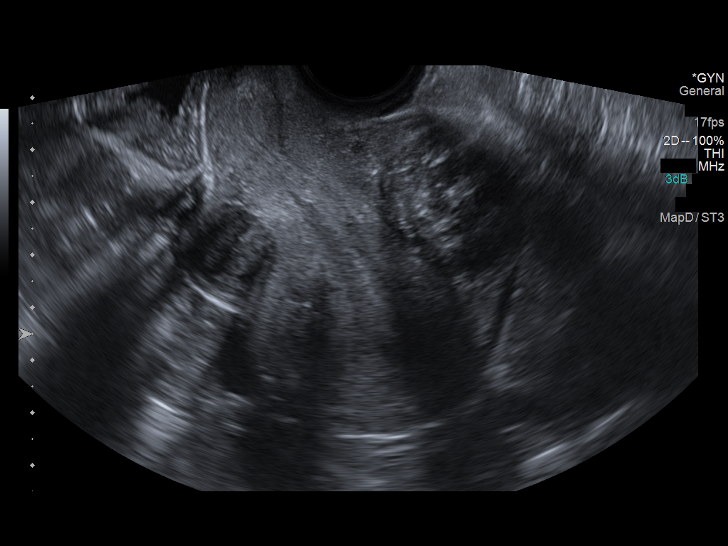
[im 55/95]
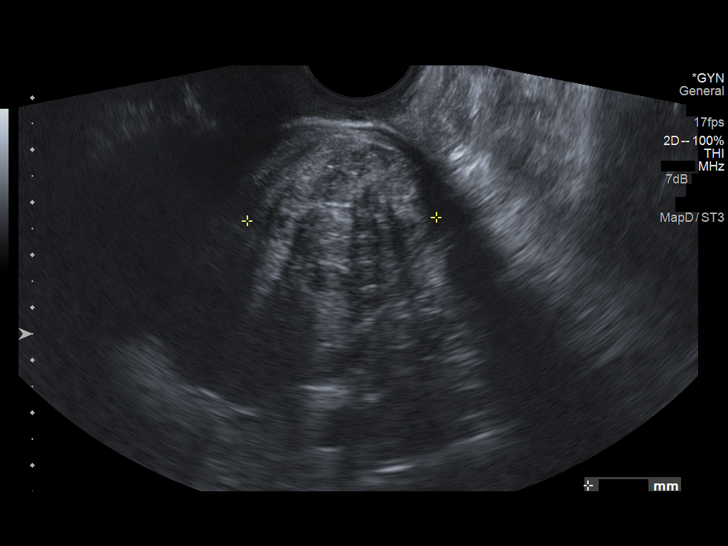
[im 63/95]
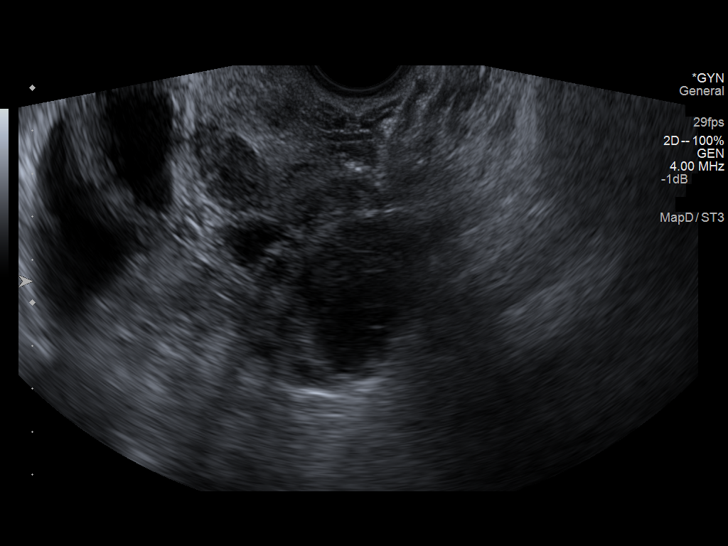
[im 71/95]
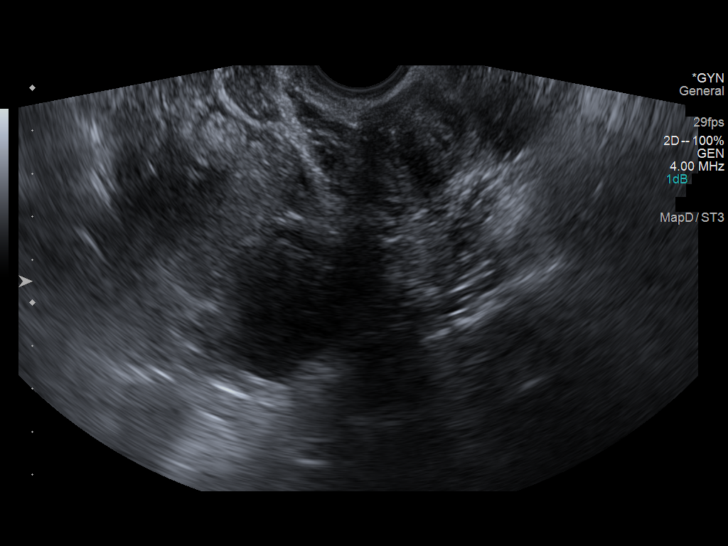
[im 79/95]
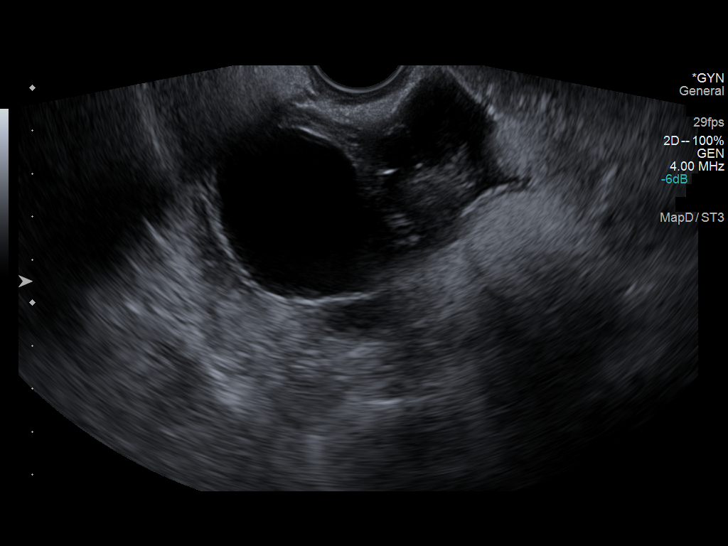
[im 87/95]
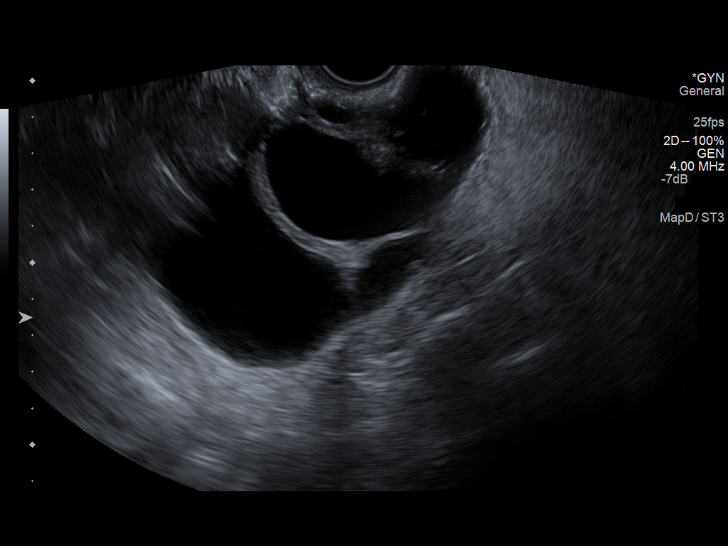
[im 95/95]
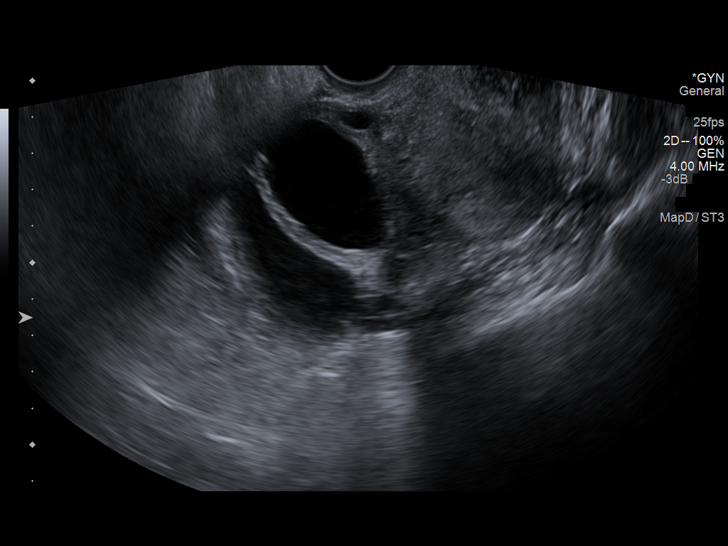

[13 of 25 positions shown; findings below may reference images not displayed]

FINDINGS: Uterus

Measurements: 7.0 x 5.9 x 6.1 cm.. The uterus is retroverted. A
cm maximum diameter and. 2.1 cm maximum diameter fibroid noted in
the uterus.

Endometrium

Thickness: 10 mm.  No focal abnormality visualized.

Right ovary

Measurements: 8.2 x 6.3 x 4.2 cm. Two large adjacent cysts are noted
in the right ovary. These are most likely benign however there is
thickening septation in the anterior most cyst. Cystic changes were
noted in this region on prior CT of 01/07/2013.

Left ovary

Measurements: 5.5 x 4.0 by 3.9 cm. There is a 3.1 x 3.1 x 2.7 cm
complex cyst in the left ovary.

Other findings

Trace free fluid.
IMPRESSION: 1. Two large adjacent cysts rather right ovary. Anterior most cyst
has a thickened septation. These were present on prior CT of
01/07/2013. Large complex cyst left ovary. This is new. Trace free
pelvic fluid. Gynecologic evaluation should be considered .
2. Retroverted fibroid uterus.

## 2014-08-26 ENCOUNTER — Emergency Department (HOSPITAL_COMMUNITY): Payer: Medicaid Other

## 2014-08-26 ENCOUNTER — Emergency Department (HOSPITAL_COMMUNITY)
Admission: EM | Admit: 2014-08-26 | Discharge: 2014-08-26 | Disposition: A | Discharge status: Home / Self Care | Payer: Self-pay | Attending: Emergency Medicine | Admitting: Emergency Medicine

## 2014-08-26 ENCOUNTER — Emergency Department (HOSPITAL_COMMUNITY): Payer: Self-pay

## 2014-08-26 ENCOUNTER — Encounter (HOSPITAL_COMMUNITY): Payer: Self-pay | Admitting: Physical Medicine and Rehabilitation

## 2014-08-26 DIAGNOSIS — S3991XA Unspecified injury of abdomen, initial encounter: Secondary | ICD-10-CM | POA: Insufficient documentation

## 2014-08-26 DIAGNOSIS — S3993XA Unspecified injury of pelvis, initial encounter: Secondary | ICD-10-CM | POA: Insufficient documentation

## 2014-08-26 DIAGNOSIS — Z8669 Personal history of other diseases of the nervous system and sense organs: Secondary | ICD-10-CM | POA: Insufficient documentation

## 2014-08-26 DIAGNOSIS — Y998 Other external cause status: Secondary | ICD-10-CM | POA: Insufficient documentation

## 2014-08-26 DIAGNOSIS — Y9289 Other specified places as the place of occurrence of the external cause: Secondary | ICD-10-CM | POA: Insufficient documentation

## 2014-08-26 DIAGNOSIS — Z3202 Encounter for pregnancy test, result negative: Secondary | ICD-10-CM | POA: Insufficient documentation

## 2014-08-26 DIAGNOSIS — N939 Abnormal uterine and vaginal bleeding, unspecified: Secondary | ICD-10-CM | POA: Insufficient documentation

## 2014-08-26 DIAGNOSIS — M25552 Pain in left hip: Secondary | ICD-10-CM

## 2014-08-26 DIAGNOSIS — W010XXA Fall on same level from slipping, tripping and stumbling without subsequent striking against object, initial encounter: Secondary | ICD-10-CM | POA: Insufficient documentation

## 2014-08-26 DIAGNOSIS — Z87891 Personal history of nicotine dependence: Secondary | ICD-10-CM | POA: Insufficient documentation

## 2014-08-26 DIAGNOSIS — J45909 Unspecified asthma, uncomplicated: Secondary | ICD-10-CM | POA: Insufficient documentation

## 2014-08-26 DIAGNOSIS — Z79899 Other long term (current) drug therapy: Secondary | ICD-10-CM | POA: Insufficient documentation

## 2014-08-26 DIAGNOSIS — Z880 Allergy status to penicillin: Secondary | ICD-10-CM | POA: Insufficient documentation

## 2014-08-26 DIAGNOSIS — R1032 Left lower quadrant pain: Secondary | ICD-10-CM

## 2014-08-26 DIAGNOSIS — Z862 Personal history of diseases of the blood and blood-forming organs and certain disorders involving the immune mechanism: Secondary | ICD-10-CM | POA: Insufficient documentation

## 2014-08-26 DIAGNOSIS — Y9389 Activity, other specified: Secondary | ICD-10-CM | POA: Insufficient documentation

## 2014-08-26 DIAGNOSIS — W19XXXA Unspecified fall, initial encounter: Secondary | ICD-10-CM

## 2014-08-26 DIAGNOSIS — Z8742 Personal history of other diseases of the female genital tract: Secondary | ICD-10-CM | POA: Insufficient documentation

## 2014-08-26 DIAGNOSIS — S79912A Unspecified injury of left hip, initial encounter: Secondary | ICD-10-CM | POA: Insufficient documentation

## 2014-08-26 LAB — WET PREP, GENITAL
TRICH WET PREP: NONE SEEN
WBC, Wet Prep HPF POC: NONE SEEN
Yeast Wet Prep HPF POC: NONE SEEN

## 2014-08-26 LAB — URINALYSIS, ROUTINE W REFLEX MICROSCOPIC
BILIRUBIN URINE: NEGATIVE
Glucose, UA: NEGATIVE mg/dL
Ketones, ur: 15 mg/dL — AB
Leukocytes, UA: NEGATIVE
Nitrite: NEGATIVE
PROTEIN: NEGATIVE mg/dL
SPECIFIC GRAVITY, URINE: 1.028 (ref 1.005–1.030)
UROBILINOGEN UA: 0.2 mg/dL (ref 0.0–1.0)
pH: 6 (ref 5.0–8.0)

## 2014-08-26 LAB — POC URINE PREG, ED: Preg Test, Ur: NEGATIVE

## 2014-08-26 LAB — URINE MICROSCOPIC-ADD ON

## 2014-08-26 MED ORDER — IBUPROFEN 800 MG PO TABS
800.0000 mg | ORAL_TABLET | Freq: Once | ORAL | Status: AC
  Administered 2014-08-26: 800 mg via ORAL
  Filled 2014-08-26: qty 1

## 2014-08-26 MED ORDER — HYDROCODONE-ACETAMINOPHEN 5-325 MG PO TABS: 1.0000 | ORAL_TABLET | Freq: Four times a day (QID) | ORAL | Status: DC | PRN

## 2014-08-26 MED ORDER — ONDANSETRON 4 MG PO TBDP
4.0000 mg | ORAL_TABLET | Freq: Once | ORAL | Status: AC
  Administered 2014-08-26: 4 mg via ORAL
  Filled 2014-08-26: qty 1

## 2014-08-26 NOTE — Discharge Instructions (Signed)
Ovarian Cyst An ovarian cyst is a fluid-filled sac that forms on an ovary. The ovaries are small organs that produce eggs in women. Various types of cysts can form on the ovaries. Most are not cancerous. Many do not cause problems, and they often go away on their own. Some may cause symptoms and require treatment. Common types of ovarian cysts include:  Functional cysts--These cysts may occur every month during the menstrual cycle. This is normal. The cysts usually go away with the next menstrual cycle if the woman does not get pregnant. Usually, there are no symptoms with a functional cyst.  Endometrioma cysts--These cysts form from the tissue that lines the uterus. They are also called "chocolate cysts" because they become filled with blood that turns brown. This type of cyst can cause pain in the lower abdomen during intercourse and with your menstrual period.  Cystadenoma cysts--This type develops from the cells on the outside of the ovary. These cysts can get very big and cause lower abdomen pain and pain with intercourse. This type of cyst can twist on itself, cut off its blood supply, and cause severe pain. It can also easily rupture and cause a lot of pain.  Dermoid cysts--This type of cyst is sometimes found in both ovaries. These cysts may contain different kinds of body tissue, such as skin, teeth, hair, or cartilage. They usually do not cause symptoms unless they get very big.  Theca lutein cysts--These cysts occur when too much of a certain hormone (human chorionic gonadotropin) is produced and overstimulates the ovaries to produce an egg. This is most common after procedures used to assist with the conception of a baby (in vitro fertilization). CAUSES   Fertility drugs can cause a condition in which multiple large cysts are formed on the ovaries. This is called ovarian hyperstimulation syndrome.  A condition called polycystic ovary syndrome can cause hormonal imbalances that can lead to  nonfunctional ovarian cysts. SIGNS AND SYMPTOMS  Many ovarian cysts do not cause symptoms. If symptoms are present, they may include:  Pelvic pain or pressure.  Pain in the lower abdomen.  Pain during sexual intercourse.  Increasing girth (swelling) of the abdomen.  Abnormal menstrual periods.  Increasing pain with menstrual periods.  Stopping having menstrual periods without being pregnant. DIAGNOSIS  These cysts are commonly found during a routine or annual pelvic exam. Tests may be ordered to find out more about the cyst. These tests may include:  Ultrasound.  X-ray of the pelvis.  CT scan.  MRI.  Blood tests. TREATMENT  Many ovarian cysts go away on their own without treatment. Your health care provider may want to check your cyst regularly for 2-3 months to see if it changes. For women in menopause, it is particularly important to monitor a cyst closely because of the higher rate of ovarian cancer in menopausal women. When treatment is needed, it may include any of the following:  A procedure to drain the cyst (aspiration). This may be done using a long needle and ultrasound. It can also be done through a laparoscopic procedure. This involves using a thin, lighted tube with a tiny camera on the end (laparoscope) inserted through a small incision.  Surgery to remove the whole cyst. This may be done using laparoscopic surgery or an open surgery involving a larger incision in the lower abdomen.  Hormone treatment or birth control pills. These methods are sometimes used to help dissolve a cyst. HOME CARE INSTRUCTIONS   Only take over-the-counter  or prescription medicines as directed by your health care provider.  Follow up with your health care provider as directed.  Get regular pelvic exams and Pap tests. SEEK MEDICAL CARE IF:   Your periods are late, irregular, or painful, or they stop.  Your pelvic pain or abdominal pain does not go away.  Your abdomen becomes  larger or swollen.  You have pressure on your bladder or trouble emptying your bladder completely.  You have pain during sexual intercourse.  You have feelings of fullness, pressure, or discomfort in your stomach.  You lose weight for no apparent reason.  You feel generally ill.  You become constipated.  You lose your appetite.  You develop acne.  You have an increase in body and facial hair.  You are gaining weight, without changing your exercise and eating habits.  You think you are pregnant. SEEK IMMEDIATE MEDICAL CARE IF:   You have increasing abdominal pain.  You feel sick to your stomach (nauseous), and you throw up (vomit).  You develop a fever that comes on suddenly.  You have abdominal pain during a bowel movement.  Your menstrual periods become heavier than usual. MAKE SURE YOU:  Understand these instructions.  Will watch your condition.  Will get help right away if you are not doing well or get worse. Document Released: 07/25/2005 Document Revised: 07/30/2013 Document Reviewed: 04/01/2013 [facility] Patient Information 2015 [place], [place]. This information is not intended to replace advice given to you by your health care provider. Make sure you discuss any questions you have with your health care provider.  Uterine Fibroid A uterine fibroid is a growth (tumor) that occurs in your uterus. This type of tumor is not cancerous and does not spread out of the uterus. You can have one or many fibroids. Fibroids can vary in size, weight, and where they grow in the uterus. Some can become quite large. Most fibroids do not require medical treatment, but some can cause pain or heavy bleeding during and between periods. CAUSES  A fibroid is the result of a single uterine cell that keeps growing (unregulated), which is different than most cells in the human body. Most cells have a control mechanism that keeps them from reproducing without control.  SIGNS AND SYMPTOMS    Bleeding.  Pelvic pain and pressure.  Bladder problems due to the size of the fibroid.  Infertility and miscarriages depending on the size and location of the fibroid. DIAGNOSIS  Uterine fibroids are diagnosed through a physical exam. Your health care provider may feel the lumpy tumors during a pelvic exam. Ultrasonography may be done to get information regarding size, location, and number of tumors.  TREATMENT   Your health care provider may recommend watchful waiting. This involves getting the fibroid checked by your health care provider to see if it grows or shrinks.   Hormone treatment or an intrauterine device (IUD) may be prescribed.   Surgery may be needed to remove the fibroids (myomectomy) or the uterus (hysterectomy). This depends on your situation. When fibroids interfere with fertility and a woman wants to become pregnant, a health care provider may recommend having the fibroids removed.  [place] care depends on how you were treated. In general:   Keep all follow-up appointments with your health care provider.   Only take over-the-counter or prescription medicines as directed by your health care provider. If you were prescribed a hormone treatment, take the hormone medicines exactly as directed. Do not take aspirin.  It can cause bleeding.   Talk to your health care provider about taking iron pills.  If your periods are troublesome but not so heavy, lie down with your feet raised slightly above your heart. Place cold packs on your lower abdomen.   If your periods are heavy, write down the number of pads or tampons you use per month. Bring this information to your health care provider.   Include green vegetables in your diet.  SEEK IMMEDIATE MEDICAL CARE IF:  You have pelvic pain or cramps not controlled with medicines.   You have a sudden increase in pelvic pain.   You have an increase in bleeding between and during periods.   You  have excessive periods and soak tampons or pads in a half hour or less.  You feel lightheaded or have fainting episodes. Document Released: 07/22/2000 Document Revised: 05/15/2013 Document Reviewed: 02/21/2013 [facility] Patient Information 2015 [place]. This information is not intended to replace advice given to you by your health care provider. Make sure you discuss any questions you have with your health care provider.  Hip Pain Your hip is the joint between your upper legs and your lower pelvis. The bones, cartilage, tendons, and muscles of your hip joint perform a lot of work each day supporting your body weight and allowing you to move around. Hip pain can range from a minor ache to severe pain in one or both of your hips. Pain may be felt on the inside of the hip joint near the groin, or the outside near the buttocks and upper thigh. You may have swelling or stiffness as well.  HOME CARE INSTRUCTIONS   Take medicines only as directed by your health care provider.  Apply ice to the injured area:  Put ice in a plastic bag.  Place a towel between your skin and the bag.  Leave the ice on for 15-20 minutes at a time, 3-4 times a day.  Keep your leg raised (elevated) when possible to lessen swelling.  Avoid activities that cause pain.  Follow specific exercises as directed by your health care provider.  Sleep with a pillow between your legs on your most comfortable side.  Record how often you have hip pain, the location of the pain, and what it feels like. SEEK MEDICAL CARE IF:   You are unable to put weight on your leg.  Your hip is red or swollen or very tender to touch.  Your pain or swelling continues or worsens after 1 week.  You have increasing difficulty walking.  You have a fever. SEEK IMMEDIATE MEDICAL CARE IF:   You have fallen.  You have a sudden increase in pain and swelling in your hip. MAKE SURE YOU:   Understand these instructions.  Will watch  your condition.  Will get help right away if you are not doing well or get worse. Document Released: 01/12/2010 Document Revised: 12/09/2013 Document Reviewed: 03/21/2013 [facility] Patient Information 2015 [place], [place]. This information is not intended to replace advice given to you by your health care provider. Make sure you discuss any questions you have with your health care provider.

## 2014-08-26 NOTE — ED Notes (Signed)
Patient transported to X-ray 

## 2014-08-26 NOTE — ED Notes (Signed)
Pt presents to department for evaluation of vaginal bleeding and pelvic pain x1 week, also states L leg pain. States she fell last Friday, now states pain from L hip region to knee. Ambulatory to triage. Pt is alert and oriented x4.

## 2014-08-26 NOTE — ED Provider Notes (Signed)
CSN: [id number]     Arrival date & time 08/26/14  1244 History   First MD Initiated Contact with Patient 08/26/14 1304     Chief Complaint  Patient presents with  . Pelvic Pain  . Leg Pain     (Consider location/radiation/quality/duration/timing/severity/associated sxs/prior Treatment) HPI Lindsay Roach is a 24 year old female with past medical history of asthma, ovarian cysts who presents the ER complaining of left sided hip pain and left lower pelvic pain. Patient reports on Friday of last week, she fell from standing position, tripping and falling, landing on her left hip. Patient states she is felt consistent pain since then with radiation into her left posterior thigh. She reports also since Sunday of this week she has been experiencing vaginal spotting and a constant lower left pelvic pain. Patient reports her pain is consistent with the pain she is experiencing the past with an ovarian cyst, however she is never experienced spotting before. Patient states she is sexually active with one partner, and denies vaginal discharge. Patient denies associated abdominal pain, nausea, vomiting, diarrhea, dysuria. Patient denies associated weakness, numbness, saddle anesthesia, bowel/bladder incontinence/retention with her hip.  Past Medical History  Diagnosis Date  . Asthma   . Allergy   . Anemia   . 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 No pertinent past surgical history. No family history on file. History  Substance Use Topics  . Smoking status: Former Smoker    Types: Cigarettes    Quit date: 05/08/2011  . Smokeless tobacco: Not on file  . Alcohol Use: No   OB History    No data available     Review of Systems  Constitutional: Negative for fever.  HENT: Negative for trouble swallowing.   Eyes: Negative for visual disturbance.  Respiratory: Negative for shortness of breath.   Cardiovascular: Negative for chest pain.  Gastrointestinal: Negative for nausea, vomiting  and abdominal pain.  Genitourinary: Positive for vaginal bleeding and pelvic pain. Negative for dysuria and hematuria.  Musculoskeletal: Negative for neck pain.  Skin: Negative for rash.  Neurological: Negative for dizziness, weakness and numbness.  Psychiatric/Behavioral: Negative.       Allergies  Tramadol and Penicillins  Home Medications   Prior to Admission medications   Medication Sig Start Date End Date Taking? Authorizing Provider  albuterol (PROVENTIL HFA;VENTOLIN HFA) 108 (90 BASE) MCG/ACT inhaler Inhale 2 puffs into the lungs every 6 (six) hours as needed for wheezing.   Yes Historical Provider, MD  clindamycin (CLEOCIN) 150 MG capsule Take 2 capsules (300 mg total) by mouth 3 (three) times daily. May dispense as  capsules Patient not taking: Reported on 08/26/2014 09/03/13   Emilia Beck, PA-C  HYDROcodone-acetaminophen (NORCO/VICODIN) 5-325 MG per tablet Take 1-2 tablets by mouth every 6 (six) hours as needed for moderate pain or severe pain. 08/26/14   Monte Fantasia, PA-C   BP 139/67 mmHg  Pulse 87  Temp(Src) 97.9 F (36.6 C) (Oral)  Resp 18  SpO2 99%  LMP 08/16/2014 (Exact Date) Physical Exam  Constitutional: She is oriented to person, place, and time. She appears well-developed and well-nourished. No distress.  HENT:  Head: Normocephalic and atraumatic.  Mouth/Throat: Oropharynx is clear and moist. No oropharyngeal exudate.  Eyes: Right eye exhibits no discharge. Left eye exhibits no discharge. No scleral icterus.  Neck: Normal range of motion.  Cardiovascular: Normal rate, regular rhythm and normal heart sounds.   No murmur heard. Pulmonary/Chest: Effort normal and breath sounds normal.  No respiratory distress.  Abdominal: Soft. There is no tenderness.  Genitourinary: Vagina normal. There is no rash, tenderness, lesion or injury on the right labia. There is no rash, tenderness, lesion or injury on the left labia. Cervix exhibits no motion tenderness,  no discharge and no friability. Right adnexum displays no mass, no tenderness and no fullness. Left adnexum displays no mass, no tenderness and no fullness. No erythema, tenderness or bleeding in the vagina. No foreign body around the vagina. No signs of injury around the vagina. No vaginal discharge found.  Scant amount of white colored discharge noted in vaginal vault. No cervical discharge or friability noted. No cervical motion tenderness. No adnexal tenderness noted. Chaperone present during entire pelvic exam.  Musculoskeletal: Normal range of motion. She exhibits no edema or tenderness.  Mild tenderness to external trochanteric/lateral trochanteric region. Mild tenderness to posterior superior iliac spine. Full range of motion of hip to active and passive range of motion without pain. Motor strength 5 out of 5 at hip, knee, ankle. DP pulse 2+. Distal sensation intact.  Neurological: She is alert and oriented to person, place, and time. She has normal strength. No cranial nerve deficit or sensory deficit. She displays a negative Romberg sign. Coordination and gait normal. GCS eye subscore is 4. GCS verbal subscore is 5. GCS motor subscore is 6.  Patient fully alert, answering questions appropriately in full, clear sentences. Cranial nerves II through XII grossly intact. Motor strength 5 out of 5 in all major muscle degrees peripheral extremity. Distal sensation intact.   Skin: Skin is warm and dry. No rash noted. She is not diaphoretic.  Psychiatric: She has a normal mood and affect.  Nursing note and vitals reviewed.   ED Course  Procedures (including critical care time) Labs Review Labs Reviewed  WET PREP, GENITAL - Abnormal; Notable for the following:    Clue Cells Wet Prep HPF POC FEW (*)    All other components within normal limits  URINALYSIS, ROUTINE W REFLEX MICROSCOPIC - Abnormal; Notable for the following:    Hgb urine dipstick TRACE (*)    Ketones, ur 15 (*)    All other  components within normal limits  URINE MICROSCOPIC-ADD ON - Abnormal; Notable for the following:    Squamous Epithelial / LPF FEW (*)    All other components within normal limits  POC URINE PREG, ED  GC/CHLAMYDIA PROBE AMP ([place])    Imaging Review US Transvaginal Non-ob  08/26/2014   CLINICAL DATA:  LEFT lower quadrant abdominal pain for 3 days question ovarian torsion  EXAM: TRANSABDOMINAL AND TRANSVAGINAL ULTRASOUND OF PELVIS  DOPPLER ULTRASOUND OF OVARIES  TECHNIQUE: Both transabdominal and transvaginal ultrasound examinations of the pelvis were performed. Transabdominal technique was performed for global imaging of the pelvis including uterus, ovaries, adnexal regions, and pelvic cul-de-sac.  It was necessary to proceed with endovaginal exam following the transabdominal exam to visualize the endometrium and ovaries. Color and duplex Doppler ultrasound was utilized to evaluate blood flow to the ovaries.  COMPARISON:  06/21/2013  FINDINGS: Uterus  Measurements: 8.0 x 5.4 x 4.9 cm. Appears retroverted. Two leiomyomata identified, both at mid uterus, 3.6 x 3.8 x 3.5 cm posteriorly and 1.9 x 1.6 x 2.2 cm anteriorly.  Endometrium  Thickness: Unable to accurately measure due to lack of a good true sagittal image, obtained images more coronal. No definite endometrial fluid.  Right ovary  Measurements: Enlarged 9.9 x 7.2 x 5.8 cm. Large septated cystic mass replacing majority  of RIGHT ovary, loculations measuring 7.7 cm and 6.0 cm in greatest dimensions, fluid containing scattered low level internal echogenicity. This has increased in size since 2014. Blood flow present in RIGHT ovary on color Doppler imaging.  Left ovary  Measurements: 3.5 x 2.3 x 2.6 cm. Small collapsed cyst. No additional masses. Blood flow present within LEFT ovary on color Doppler imaging.  Pulsed Doppler evaluation of both ovaries demonstrates normal low-resistance arterial and venous waveforms.  Other findings  Trace free fluid.   IMPRESSION: Uterine leiomyomata within retroverted uterus.  Unable accurately measure endometrial complex.  No evidence of LEFT ovarian torsion.  Large complex septated RIGHT ovarian cystic mass increased in size since 2014; unable to exclude cystic ovarian neoplasm, recommend gynecologic evaluation for excision.   Electronically Signed   By: Ulyses Southward M.D.   On: 08/26/2014 16:12   US Pelvis Complete  08/26/2014   CLINICAL DATA:  LEFT lower quadrant abdominal pain for 3 days question ovarian torsion  EXAM: TRANSABDOMINAL AND TRANSVAGINAL ULTRASOUND OF PELVIS  DOPPLER ULTRASOUND OF OVARIES  TECHNIQUE: Both transabdominal and transvaginal ultrasound examinations of the pelvis were performed. Transabdominal technique was performed for global imaging of the pelvis including uterus, ovaries, adnexal regions, and pelvic cul-de-sac.  It was necessary to proceed with endovaginal exam following the transabdominal exam to visualize the endometrium and ovaries. Color and duplex Doppler ultrasound was utilized to evaluate blood flow to the ovaries.  COMPARISON:  06/21/2013  FINDINGS: Uterus  Measurements: 8.0 x 5.4 x 4.9 cm. Appears retroverted. Two leiomyomata identified, both at mid uterus, 3.6 x 3.8 x 3.5 cm posteriorly and 1.9 x 1.6 x 2.2 cm anteriorly.  Endometrium  Thickness: Unable to accurately measure due to lack of a good true sagittal image, obtained images more coronal. No definite endometrial fluid.  Right ovary  Measurements: Enlarged 9.9 x 7.2 x 5.8 cm. Large septated cystic mass replacing majority of RIGHT ovary, loculations measuring 7.7 cm and 6.0 cm in greatest dimensions, fluid containing scattered low level internal echogenicity. This has increased in size since 2014. Blood flow present in RIGHT ovary on color Doppler imaging.  Left ovary  Measurements: 3.5 x 2.3 x 2.6 cm. Small collapsed cyst. No additional masses. Blood flow present within LEFT ovary on color Doppler imaging.  Pulsed Doppler  evaluation of both ovaries demonstrates normal low-resistance arterial and venous waveforms.  Other findings  Trace free fluid.  IMPRESSION: Uterine leiomyomata within retroverted uterus.  Unable accurately measure endometrial complex.  No evidence of LEFT ovarian torsion.  Large complex septated RIGHT ovarian cystic mass increased in size since 2014; unable to exclude cystic ovarian neoplasm, recommend gynecologic evaluation for excision.   Electronically Signed   By: Ulyses Southward M.D.   On: 08/26/2014 16:12   [place] Art/ven Flow Abd Pelv Doppler  08/26/2014   CLINICAL DATA:  LEFT lower quadrant abdominal pain for 3 days question ovarian torsion  EXAM: TRANSABDOMINAL AND TRANSVAGINAL ULTRASOUND OF PELVIS  DOPPLER ULTRASOUND OF OVARIES  TECHNIQUE: Both transabdominal and transvaginal ultrasound examinations of the pelvis were performed. Transabdominal technique was performed for global imaging of the pelvis including uterus, ovaries, adnexal regions, and pelvic cul-de-sac.  It was necessary to proceed with endovaginal exam following the transabdominal exam to visualize the endometrium and ovaries. Color and duplex Doppler ultrasound was utilized to evaluate blood flow to the ovaries.  COMPARISON:  06/21/2013  FINDINGS: Uterus  Measurements: 8.0 x 5.4 x 4.9 cm. Appears retroverted. Two leiomyomata identified, both at mid  uterus, 3.6 x 3.8 x 3.5 cm posteriorly and 1.9 x 1.6 x 2.2 cm anteriorly.  Endometrium  Thickness: Unable to accurately measure due to lack of a good true sagittal image, obtained images more coronal. No definite endometrial fluid.  Right ovary  Measurements: Enlarged 9.9 x 7.2 x 5.8 cm. Large septated cystic mass replacing majority of RIGHT ovary, loculations measuring 7.7 cm and 6.0 cm in greatest dimensions, fluid containing scattered low level internal echogenicity. This has increased in size since 2014. Blood flow present in RIGHT ovary on color Doppler imaging.  Left ovary  Measurements: 3.5 x  2.3 x 2.6 cm. Small collapsed cyst. No additional masses. Blood flow present within LEFT ovary on color Doppler imaging.  Pulsed Doppler evaluation of both ovaries demonstrates normal low-resistance arterial and venous waveforms.  Other findings  Trace free fluid.  IMPRESSION: Uterine leiomyomata within retroverted uterus.  Unable accurately measure endometrial complex.  No evidence of LEFT ovarian torsion.  Large complex septated RIGHT ovarian cystic mass increased in size since 2014; unable to exclude cystic ovarian neoplasm, recommend gynecologic evaluation for excision.   Electronically Signed   By: Ulyses Southward M.D.   On: 08/26/2014 16:12   Dg Hip Unilat With Pelvis 2-3 Views Left  08/26/2014   CLINICAL DATA:  Fall down stairs 5 days ago with persistent hip pain  EXAM: DG HIP W/ PELVIS 2-3V*L*  COMPARISON:  None.  FINDINGS: The pelvic ring is intact. No acute fracture or dislocation is noted. The lower lumbar spine is within normal limits. No soft tissue changes are noted.  IMPRESSION: No acute abnormality seen.   Electronically Signed   By: Alcide Clever M.D.   On: 08/26/2014 14:39     EKG Interpretation None      MDM   Final diagnoses:  Fall  Left hip pain  Abdominal pain, left lower quadrant    Patient here with left-sided pelvic pain and left-sided hip pain. Patient's pain status post fall, patient reporting her left lower pelvic pain is consistent with previous ovarian cysts. Pelvic exam benign, abdominal exam benign. No concern for acute abdomen. No concern for PID. Low suspicion for ovarian torsion, however cannot rule out without ultrasound. Pregnancy negative. No concern for ectopic pregnancy.  US pelvis with impression: Uterine leiomyomata within retroverted uterus.  Unable accurately measure endometrial complex.  No evidence of LEFT ovarian torsion.  Large complex septated RIGHT ovarian cystic mass increased in size since 2014; unable to exclude cystic ovarian neoplasm,  recommend gynecologic evaluation for excision.  Also noted not in impression was a small collapsed cyst in the left ovary. Collapsed cyst in left ovary could explain patient's mild amounts vaginal spotting and lower abdominal pain.  In regard to patient's hip, patient is neurovascularly intact without obvious evidence of trauma externally or with patient's range of motion, neuro function or vascular status. Radiographs of pelvis without evidence of acute abnormality noted. I encouraged RICE therapy and follow-up with orthopedics should her symptoms persist or worsen.  I discussed these results with patient, and strongly encouraged patient to follow up with OB/GYN. Patient states she does not have OB/GYN in the area, we'll refer to women's clinic. During entire stay, patient is well-appearing, nontoxic, non-tachycardic, nontachypneic, non-hypoxic, afebrile in no acute distress. Patient hemodynamically stable to be discharged and have follow-up as an outpatient. I discussed return precautions with patient, and patient verbalizes understanding and agreement of this plan. I encouraged patient to call or return to the ER should she  have any questions or concerns.  BP 139/67 mmHg  Pulse 87  Temp(Src) 97.9 F (36.6 C) (Oral)  Resp 18  SpO2 99%  LMP 08/16/2014 (Exact Date)  Signed,  Ladona MowJoe Dashonna Chagnon, PA-C 4:48 PM  Patient discussed with Dr. Geoffery Lyonsouglas Delo, MD.    Monte FantasiaJoseph W Duc Crocket, PA-C 08/26/14 1648  Geoffery Lyonsouglas Delo, MD 08/26/14 [phone]

## 2014-08-27 LAB — GC/CHLAMYDIA PROBE AMP (~~LOC~~) NOT AT ARMC
Chlamydia: NEGATIVE
NEISSERIA GONORRHEA: NEGATIVE

## 2014-09-29 ENCOUNTER — Encounter: Payer: Medicaid Other | Admitting: Obstetrics & Gynecology

## 2014-10-28 ENCOUNTER — Emergency Department (HOSPITAL_COMMUNITY): Payer: Medicaid Other

## 2014-10-28 ENCOUNTER — Emergency Department (HOSPITAL_COMMUNITY)
Admission: EM | Admit: 2014-10-28 | Discharge: 2014-10-28 | Disposition: A | Discharge status: Home / Self Care | Payer: Medicaid Other | Attending: Emergency Medicine | Admitting: Emergency Medicine

## 2014-10-28 ENCOUNTER — Encounter (HOSPITAL_COMMUNITY): Payer: Self-pay

## 2014-10-28 DIAGNOSIS — Y9389 Activity, other specified: Secondary | ICD-10-CM | POA: Insufficient documentation

## 2014-10-28 DIAGNOSIS — S60212A Contusion of left wrist, initial encounter: Secondary | ICD-10-CM | POA: Insufficient documentation

## 2014-10-28 DIAGNOSIS — Z880 Allergy status to penicillin: Secondary | ICD-10-CM | POA: Insufficient documentation

## 2014-10-28 DIAGNOSIS — Y998 Other external cause status: Secondary | ICD-10-CM | POA: Insufficient documentation

## 2014-10-28 DIAGNOSIS — I10 Essential (primary) hypertension: Secondary | ICD-10-CM | POA: Insufficient documentation

## 2014-10-28 DIAGNOSIS — W01198A Fall on same level from slipping, tripping and stumbling with subsequent striking against other object, initial encounter: Secondary | ICD-10-CM | POA: Insufficient documentation

## 2014-10-28 DIAGNOSIS — T148XXA Other injury of unspecified body region, initial encounter: Secondary | ICD-10-CM

## 2014-10-28 DIAGNOSIS — Y9289 Other specified places as the place of occurrence of the external cause: Secondary | ICD-10-CM | POA: Insufficient documentation

## 2014-10-28 DIAGNOSIS — Z79899 Other long term (current) drug therapy: Secondary | ICD-10-CM | POA: Insufficient documentation

## 2014-10-28 DIAGNOSIS — Z862 Personal history of diseases of the blood and blood-forming organs and certain disorders involving the immune mechanism: Secondary | ICD-10-CM | POA: Insufficient documentation

## 2014-10-28 DIAGNOSIS — M25532 Pain in left wrist: Secondary | ICD-10-CM

## 2014-10-28 DIAGNOSIS — J45909 Unspecified asthma, uncomplicated: Secondary | ICD-10-CM | POA: Insufficient documentation

## 2014-10-28 DIAGNOSIS — Z87891 Personal history of nicotine dependence: Secondary | ICD-10-CM | POA: Insufficient documentation

## 2014-10-28 MED ORDER — HYDROCODONE-ACETAMINOPHEN 5-325 MG PO TABS: 1.0000 | ORAL_TABLET | Freq: Four times a day (QID) | ORAL | Status: DC | PRN

## 2014-10-28 MED ORDER — NAPROXEN 500 MG PO TABS: 500.0000 mg | ORAL_TABLET | Freq: Two times a day (BID) | ORAL | Status: AC | PRN

## 2014-10-28 MED ORDER — HYDROCODONE-ACETAMINOPHEN 5-325 MG PO TABS
1.0000 | ORAL_TABLET | Freq: Once | ORAL | Status: AC
  Administered 2014-10-28: 1 via ORAL
  Filled 2014-10-28: qty 1

## 2014-10-28 NOTE — ED Provider Notes (Signed)
CSN: [id number]     Arrival date & time 10/28/14  1339 History  This chart was scribed for non-physician practitioner, Allen Derry, PA-C working with Elwin Mocha, MD by Luisa Dago, Medical Scribe. This patient was seen in room WTR6/WTR6 and the patient's care was started at 2:37 PM.     Chief Complaint  Patient presents with  . Wrist Injury   Patient is a 24 y.o. female presenting with wrist injury. The history is provided by the patient and medical records. No language interpreter was used.  Wrist Injury Location:  Wrist Time since incident:  2 hours Injury: yes   Mechanism of injury: crush   Crush injury:    Mechanism:  Falling object (husband's head) Wrist location:  L wrist Pain details:    Quality:  Aching and throbbing   Radiates to:  Does not radiate   Severity:  Mild   Onset quality:  Sudden   Duration:  2 hours   Timing:  Constant   Progression:  Unchanged Chronicity:  New Handedness:  Right-handed Dislocation: no   Foreign body present:  No foreign bodies Tetanus status:  Up to date Prior injury to area:  No Relieved by:  Nothing Worsened by:  Nothing tried Ineffective treatments:  None tried Associated symptoms: tingling   Associated symptoms: no decreased range of motion, no fever, no numbness and no swelling    HPI Comments: Lindsay Roach is a 24 y.o. female who presents to the Emergency Department complaining of of a left wrist injury that occurred approximately 2 hours ago. Pt states that she has a family member here in the ED and while taking his BP the pt fell back and his head hit her left wrist and smashed it in-between the bed rail and his head. She states that the pain is a 8/10 throbbing, constant pain that extends from her wrist and radiates into her left index finger. Pt is also admits to feeling some mild tingling sensation to her left wrist near the abrasion. Skin tear noted to left wrist. Last tetanus vaccine was 2012/2013. Pt denies  any weakness, numbness, nausea, emesis, bruising, CP, elbow pain, SOB, or other injuries.   Past Medical History  Diagnosis Date  . Asthma   . Allergy   . Anemia   . 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 No pertinent past surgical history. History reviewed. No pertinent family history. History  Substance Use Topics  . Smoking status: Former Smoker    Types: Cigarettes    Quit date: 05/08/2011  . Smokeless tobacco: Not on file  . Alcohol Use: No   OB History    No data available     Review of Systems  Constitutional: Negative for fever.  Musculoskeletal: Positive for arthralgias. Negative for myalgias and joint swelling.  Skin: Positive for wound (abrasion). Negative for color change.  Allergic/Immunologic: Negative for immunocompromised state.  Neurological: Negative for weakness and numbness.  10 Systems reviewed and all are negative for acute change except as noted in the HPI. Allergies  Tramadol and Penicillins  Home Medications   Prior to Admission medications   Medication Sig Start Date End Date Taking? Authorizing Provider  albuterol (PROVENTIL HFA;VENTOLIN HFA) 108 (90 BASE) MCG/ACT inhaler Inhale 2 puffs into the lungs every 6 (six) hours as needed for wheezing.    Historical Provider, MD  clindamycin (CLEOCIN) 150 MG capsule Take 2 capsules (300 mg total) by mouth 3 (three) times daily. May  dispense as  capsules Patient not taking: Reported on 08/26/2014 09/03/13   Emilia Beck, PA-C  HYDROcodone-acetaminophen (NORCO/VICODIN) 5-325 MG per tablet Take 1-2 tablets by mouth every 6 (six) hours as needed for moderate pain or severe pain. 08/26/14   Ladona Mow, PA-C   BP 142/103 mmHg  Pulse 85  Temp(Src) 98.3 F (36.8 C) (Oral)  Resp 18  SpO2 100%  LMP 10/25/2014  Physical Exam  Constitutional: She is oriented to person, place, and time. Vital signs are normal. She appears well-developed and well-nourished.  Non-toxic appearance. No distress.   Afebrile, nontoxic, NAD  HENT:  Head: Normocephalic and atraumatic.  Mouth/Throat: Mucous membranes are normal.  Eyes: Conjunctivae and EOM are normal. Right eye exhibits no discharge. Left eye exhibits no discharge.  Neck: Normal range of motion. Neck supple.  Cardiovascular: Normal rate and intact distal pulses.   Pulmonary/Chest: Effort normal. No respiratory distress.  Abdominal: Normal appearance. She exhibits no distension.  Musculoskeletal: Normal range of motion.       Left wrist: She exhibits tenderness, bony tenderness and laceration (abrasion to ulnar side). She exhibits normal range of motion, no swelling, no crepitus and no deformity.  Left wrist with FrOM intact, with mild TtP over the ulna, with small abrasion to this area, no swelling or crepitus, no deformity, no bruising. Strength and seansation grossly intact. Distal pulses intact. Wiggles all digits and no TtP in the hand or digits.  Neurological: She is alert and oriented to person, place, and time. She has normal strength. No sensory deficit.  Skin: Skin is warm and dry. Abrasion noted. No rash noted.  Psychiatric: She has a normal mood and affect. Her behavior is normal.  Nursing note and vitals reviewed.   ED Course  Procedures (including critical care time)  DIAGNOSTIC STUDIES: Oxygen Saturation is 100% on RA, normal by my interpretation.    COORDINATION OF CARE: 2:41 PM- ACE wrap will be applied to her left wrist. Will d/c home with pain medication to take as PRN first dose will be given here in the ED. Pt is not driving home. Pt advised of plan for treatment and pt agrees.  Medications  HYDROcodone-acetaminophen (NORCO/VICODIN) 5-325 MG per tablet 1 tablet (not administered)    Imaging Review Dg Wrist Complete Left  10/28/2014   CLINICAL DATA:  Wrist injury post fall  EXAM: LEFT WRIST - COMPLETE 3+ VIEW  COMPARISON:  None.  FINDINGS: Four views of the left wrist submitted. No acute fracture or  subluxation. No radiopaque foreign body.  IMPRESSION: Negative.   Electronically Signed   By: Natasha Mead M.D.   On: 10/28/2014 14:32   Dg Hand Complete Left  10/28/2014   CLINICAL DATA:  Wrist injury post fall  EXAM: LEFT HAND - COMPLETE 3+ VIEW  COMPARISON:  None.  FINDINGS: Three views of left hand submitted. No acute fracture or subluxation. No radiopaque foreign body.  IMPRESSION: Negative.   Electronically Signed   By: Natasha Mead M.D.   On: 10/28/2014 14:33     MDM   Final diagnoses:  Left wrist pain  Wrist contusion, left, initial encounter  Abrasion  HTN (hypertension), benign    24 y.o. female here with L wrist pain after blunt trauma, compressed between husband's head and a railing. Neurovascularly intact with soft compartments. Xrays obtained which were negative. Small abrasion, tetanus UTD, no further intervention needed, discussed neosporin and bandaid use. Will ace wrap, and provide pain meds. Discussed RICE therapy. Will have her  f/up with [facility] in 1wk. I explained the diagnosis and have given explicit precautions to return to the ER including for any other new or worsening symptoms. The patient understands and accepts the medical plan as it's been dictated and I have answered their questions. Discharge instructions concerning home care and prescriptions have been given. The patient is STABLE and is discharged to home in good condition.   I personally performed the services described in this documentation, which was scribed in my presence. The recorded information has been reviewed and is accurate.  BP 142/103 mmHg  Pulse 85  Temp(Src) 98.3 F (36.8 C) (Oral)  Resp 18  SpO2 100%  LMP 10/25/2014  Meds ordered this encounter  Medications  . HYDROcodone-acetaminophen (NORCO/VICODIN) 5-325 MG per tablet 1 tablet    Sig:   . HYDROcodone-acetaminophen (NORCO) 5-325 MG per tablet    Sig: Take 1 tablet by mouth every 6 (six) hours as needed for severe pain.     Dispense:  6 tablet    Refill:  0    Order Specific Question:  Supervising Provider    Answer:  MILLER, BRIAN [[id number]]  . naproxen (NAPROSYN) 500 MG tablet    Sig: Take 1 tablet (500 mg total) by mouth 2 (two) times daily as needed for mild pain, moderate pain or headache (TAKE WITH MEALS.).    Dispense:  20 tablet    Refill:  0    Order Specific Question:  Supervising Provider    Answer:  Eber HongMILLER, BRIAN [[id number]]      Ebbie Sorenson Camprubi-Soms, PA-C 10/28/14 1500  Elwin MochaBlair Walden, MD 10/28/14 1520

## 2014-10-28 NOTE — ED Notes (Signed)
Pt was in room with a family member at hospital.  Family member fell backwards after standing.  Head struck pt left wrist.  C/O pain in wrist and pain in left index finger.

## 2014-10-28 NOTE — ED Notes (Signed)
Bed: [facility] Expected date:  Expected time:  Means of arrival:  Comments: 

## 2014-10-28 NOTE — Discharge Instructions (Signed)
Wear wrist ace wrap as needed for comfort. Ice and elevate wrist throughout the day. Alternate between naprosyn and norco for pain relief. Do not drive or operate machinery with pain medication use. Follow up with [facility] in 1 week for ongoing recheck and management. For your abrasion, keep it clean and dry with a band aid and neosporin. Watch for signs of infection including redness, swelling, drainage/pus, or fevers. Return to the ER for changes or worsening symptoms.    Contusion A contusion is a deep bruise. Contusions are the result of an injury that caused bleeding under the skin. The contusion may turn blue, purple, or yellow. Minor injuries will give you a painless contusion, but more severe contusions may stay painful and swollen for a few weeks.  CAUSES  A contusion is usually caused by a blow, trauma, or direct force to an area of the body. SYMPTOMS   Swelling and redness of the injured area.  Bruising of the injured area.  Tenderness and soreness of the injured area.  Pain. DIAGNOSIS  The diagnosis can be made by taking a history and physical exam. An X-ray, CT scan, or MRI may be needed to determine if there were any associated injuries, such as fractures. TREATMENT  Specific treatment will depend on what area of the body was injured. In general, the best treatment for a contusion is resting, icing, elevating, and applying cold compresses to the injured area. Over-the-counter medicines may also be recommended for pain control. Ask your caregiver what the best treatment is for your contusion. HOME CARE INSTRUCTIONS   Put ice on the injured area.  Put ice in a plastic bag.  Place a towel between your skin and the bag.  Leave the ice on for 15-20 minutes, 3-4 times a day, or as directed by your health care provider.  Only take over-the-counter or prescription medicines for pain, discomfort, or fever as directed by your caregiver. Your caregiver may recommend  avoiding anti-inflammatory medicines (aspirin, ibuprofen, and naproxen) for 48 hours because these medicines may increase bruising.  Rest the injured area.  If possible, elevate the injured area to reduce swelling. SEEK IMMEDIATE MEDICAL CARE IF:   You have increased bruising or swelling.  You have pain that is getting worse.  Your swelling or pain is not relieved with medicines. MAKE SURE YOU:   Understand these instructions.  Will watch your condition.  Will get help right away if you are not doing well or get worse. Document Released: 05/04/2005 Document Revised: 07/30/2013 Document Reviewed: 05/30/2011 [facility] Patient Information 2015 [place], [place]. This information is not intended to replace advice given to you by your health care provider. Make sure you discuss any questions you have with your health care provider.  Cryotherapy Cryotherapy means treatment with cold. Ice or gel packs can be used to reduce both pain and swelling. Ice is the most helpful within the first 24 to 48 hours after an injury or flare-up from overusing a muscle or joint. Sprains, strains, spasms, burning pain, shooting pain, and aches can all be eased with ice. Ice can also be used when recovering from surgery. Ice is effective, has very few side effects, and is safe for most people to use. PRECAUTIONS  Ice is not a safe treatment option for people with:  Raynaud phenomenon. This is a condition affecting small blood vessels in the extremities. Exposure to cold may cause your problems to return.  Cold hypersensitivity. There are many forms of cold  hypersensitivity, including:  Cold urticaria. Red, itchy hives appear on the skin when the tissues begin to warm after being iced.  Cold erythema. This is a red, itchy rash caused by exposure to cold.  Cold hemoglobinuria. Red blood cells break down when the tissues begin to warm after being iced. The hemoglobin that carry oxygen are passed into the urine  because they cannot combine with blood proteins fast enough.  Numbness or altered sensitivity in the area being iced. If you have any of the following conditions, do not use ice until you have discussed cryotherapy with your caregiver:  Heart conditions, such as arrhythmia, angina, or chronic heart disease.  High blood pressure.  Healing wounds or open skin in the area being iced.  Current infections.  Rheumatoid arthritis.  Poor circulation.  Diabetes. Ice slows the blood flow in the region it is applied. This is beneficial when trying to stop inflamed tissues from spreading irritating chemicals to surrounding tissues. However, if you expose your skin to cold temperatures for too long or without the proper protection, you can damage your skin or nerves. Watch for signs of skin damage due to cold. HOME CARE INSTRUCTIONS Follow these tips to use ice and cold packs safely.  Place a dry or damp towel between the ice and skin. A damp towel will cool the skin more quickly, so you may need to shorten the time that the ice is used.  For a more rapid response, add gentle compression to the ice.  Ice for no more than 10 to 20 minutes at a time. The bonier the area you are icing, the less time it will take to get the benefits of ice.  Check your skin after 5 minutes to make sure there are no signs of a poor response to cold or skin damage.  Rest 20 minutes or more between uses.  Once your skin is numb, you can end your treatment. You can test numbness by very lightly touching your skin. The touch should be so light that you do not see the skin dimple from the pressure of your fingertip. When using ice, most people will feel these normal sensations in this order: cold, burning, aching, and numbness.  Do not use ice on someone who cannot communicate their responses to pain, such as small children or people with dementia. HOW TO MAKE AN ICE PACK Ice packs are the most common way to use ice  therapy. Other methods include ice massage, ice baths, and cryosprays. Muscle creams that cause a cold, tingly feeling do not offer the same benefits that ice offers and should not be used as a substitute unless recommended by your caregiver. To make an ice pack, do one of the following:  Place crushed ice or a bag of frozen vegetables in a sealable plastic bag. Squeeze out the excess air. Place this bag inside another plastic bag. Slide the bag into a pillowcase or place a damp towel between your skin and the bag.  Mix 3 parts water with 1 part rubbing alcohol. Freeze the mixture in a sealable plastic bag. When you remove the mixture from the freezer, it will be slushy. Squeeze out the excess air. Place this bag inside another plastic bag. Slide the bag into a pillowcase or place a damp towel between your skin and the bag. SEEK MEDICAL CARE IF:  You develop white spots on your skin. This may give the skin a blotchy (mottled) appearance.  Your skin turns blue or pale.  Your skin becomes waxy or hard.  Your swelling gets worse. MAKE SURE YOU:   Understand these instructions.  Will watch your condition.  Will get help right away if you are not doing well or get worse. Document Released: 03/21/2011 Document Revised: 12/09/2013 Document Reviewed: 03/21/2011 [facility] Patient Information 2015 [place], [place]. This information is not intended to replace advice given to you by your health care provider. Make sure you discuss any questions you have with your health care provider.  Abrasions An abrasion is a cut or scrape of the skin. Abrasions do not go through all layers of the skin. HOME CARE  If a bandage (dressing) was put on your wound, change it as told by your doctor. If the bandage sticks, soak it off with warm.  Wash the area with water and soap 2 times a day. Rinse off the soap. Pat the area dry with a clean towel.  Put on medicated cream (ointment) as told by your doctor.  Change  your bandage right away if it gets wet or dirty.  Only take medicine as told by your doctor.  See your doctor within 24-48 hours to get your wound checked.  Check your wound for redness, puffiness (swelling), or yellowish-white fluid (pus). GET HELP RIGHT AWAY IF:   You have more pain in the wound.  You have redness, swelling, or tenderness around the wound.  You have pus coming from the wound.  You have a fever or lasting symptoms for more than 2-3 days.  You have a fever and your symptoms suddenly get worse.  You have a bad smell coming from the wound or bandage. MAKE SURE YOU:   Understand these instructions.  Will watch your condition.  Will get help right away if you are not doing well or get worse. Document Released: 01/11/2008 Document Revised: 04/18/2012 Document Reviewed: 06/28/2011 [facility] Patient Information 2015 [place], [place]. This information is not intended to replace advice given to you by your health care provider. Make sure you discuss any questions you have with your health care provider.

## 2014-12-26 ENCOUNTER — Emergency Department (HOSPITAL_COMMUNITY): Payer: Medicaid Other

## 2014-12-26 ENCOUNTER — Emergency Department (HOSPITAL_COMMUNITY)
Admission: EM | Admit: 2014-12-26 | Discharge: 2014-12-26 | Disposition: A | Discharge status: Home / Self Care | Payer: Medicaid Other | Attending: Emergency Medicine | Admitting: Emergency Medicine

## 2014-12-26 ENCOUNTER — Encounter (HOSPITAL_COMMUNITY): Payer: Self-pay

## 2014-12-26 DIAGNOSIS — Z792 Long term (current) use of antibiotics: Secondary | ICD-10-CM | POA: Insufficient documentation

## 2014-12-26 DIAGNOSIS — S0081XA Abrasion of other part of head, initial encounter: Secondary | ICD-10-CM | POA: Insufficient documentation

## 2014-12-26 DIAGNOSIS — Z862 Personal history of diseases of the blood and blood-forming organs and certain disorders involving the immune mechanism: Secondary | ICD-10-CM | POA: Insufficient documentation

## 2014-12-26 DIAGNOSIS — S99912A Unspecified injury of left ankle, initial encounter: Secondary | ICD-10-CM | POA: Insufficient documentation

## 2014-12-26 DIAGNOSIS — Z79899 Other long term (current) drug therapy: Secondary | ICD-10-CM | POA: Insufficient documentation

## 2014-12-26 DIAGNOSIS — S80212A Abrasion, left knee, initial encounter: Secondary | ICD-10-CM | POA: Insufficient documentation

## 2014-12-26 DIAGNOSIS — Z880 Allergy status to penicillin: Secondary | ICD-10-CM | POA: Insufficient documentation

## 2014-12-26 DIAGNOSIS — Z87891 Personal history of nicotine dependence: Secondary | ICD-10-CM | POA: Insufficient documentation

## 2014-12-26 DIAGNOSIS — J45909 Unspecified asthma, uncomplicated: Secondary | ICD-10-CM | POA: Insufficient documentation

## 2014-12-26 DIAGNOSIS — Y9389 Activity, other specified: Secondary | ICD-10-CM | POA: Insufficient documentation

## 2014-12-26 DIAGNOSIS — Y998 Other external cause status: Secondary | ICD-10-CM | POA: Insufficient documentation

## 2014-12-26 DIAGNOSIS — Y9289 Other specified places as the place of occurrence of the external cause: Secondary | ICD-10-CM | POA: Insufficient documentation

## 2014-12-26 MED ORDER — IBUPROFEN 800 MG PO TABS: 800.0000 mg | ORAL_TABLET | Freq: Three times a day (TID) | ORAL | Status: DC | PRN

## 2014-12-26 MED ORDER — TETANUS-DIPHTH-ACELL PERTUSSIS 5-2.5-18.5 LF-MCG/0.5 IM SUSP
0.5000 mL | Freq: Once | INTRAMUSCULAR | Status: AC
  Administered 2014-12-26: 0.5 mL via INTRAMUSCULAR
  Filled 2014-12-26: qty 0.5

## 2014-12-26 MED ORDER — OXYCODONE-ACETAMINOPHEN 5-325 MG PO TABS
1.0000 | ORAL_TABLET | Freq: Once | ORAL | Status: AC
  Administered 2014-12-26: 1 via ORAL
  Filled 2014-12-26: qty 1

## 2014-12-26 MED ORDER — HYDROCODONE-ACETAMINOPHEN 5-325 MG PO TABS: 1.0000 | ORAL_TABLET | Freq: Four times a day (QID) | ORAL | Status: DC | PRN

## 2014-12-26 NOTE — Discharge Instructions (Signed)
Your x-rays did not show any abnormality.  Follow-up with the orthopedist provided.  Use ice on the areas that are sore, along with elevation
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
## 2015-02-01 ENCOUNTER — Encounter (HOSPITAL_COMMUNITY): Payer: Self-pay | Admitting: *Deleted

## 2015-02-01 ENCOUNTER — Emergency Department (HOSPITAL_COMMUNITY): Payer: Medicaid Other

## 2015-02-01 ENCOUNTER — Emergency Department (HOSPITAL_COMMUNITY)
Admission: EM | Admit: 2015-02-01 | Discharge: 2015-02-01 | Disposition: A | Discharge status: Home / Self Care | Payer: Medicaid Other | Attending: Emergency Medicine | Admitting: Emergency Medicine

## 2015-02-01 DIAGNOSIS — Z87828 Personal history of other (healed) physical injury and trauma: Secondary | ICD-10-CM | POA: Insufficient documentation

## 2015-02-01 DIAGNOSIS — S93402A Sprain of unspecified ligament of left ankle, initial encounter: Secondary | ICD-10-CM

## 2015-02-01 DIAGNOSIS — J45909 Unspecified asthma, uncomplicated: Secondary | ICD-10-CM | POA: Insufficient documentation

## 2015-02-01 DIAGNOSIS — Y939 Activity, unspecified: Secondary | ICD-10-CM | POA: Insufficient documentation

## 2015-02-01 DIAGNOSIS — Z8669 Personal history of other diseases of the nervous system and sense organs: Secondary | ICD-10-CM | POA: Insufficient documentation

## 2015-02-01 DIAGNOSIS — Z880 Allergy status to penicillin: Secondary | ICD-10-CM | POA: Insufficient documentation

## 2015-02-01 DIAGNOSIS — Z87891 Personal history of nicotine dependence: Secondary | ICD-10-CM | POA: Insufficient documentation

## 2015-02-01 DIAGNOSIS — Y999 Unspecified external cause status: Secondary | ICD-10-CM | POA: Insufficient documentation

## 2015-02-01 DIAGNOSIS — Y929 Unspecified place or not applicable: Secondary | ICD-10-CM | POA: Insufficient documentation

## 2015-02-01 DIAGNOSIS — Z862 Personal history of diseases of the blood and blood-forming organs and certain disorders involving the immune mechanism: Secondary | ICD-10-CM | POA: Insufficient documentation

## 2015-02-01 MED ORDER — IBUPROFEN 800 MG PO TABS: 800.0000 mg | ORAL_TABLET | Freq: Three times a day (TID) | ORAL | Status: AC | PRN
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
## 2015-02-01 NOTE — ED Notes (Signed)
The pt returned from xray 

## 2015-02-01 NOTE — Discharge Instructions (Signed)
Your x-rays did not show any abnormality other than some joint swelling.  Follow-up with the orthopedist provided.  Ice and heat to your ankle also elevated as much as possible
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
## 2015-02-20 ENCOUNTER — Emergency Department (HOSPITAL_COMMUNITY)
Admission: EM | Admit: 2015-02-20 | Discharge: 2015-02-20 | Disposition: A | Discharge status: Home / Self Care | Payer: Medicaid Other | Attending: Emergency Medicine | Admitting: Emergency Medicine

## 2015-02-20 ENCOUNTER — Encounter (HOSPITAL_COMMUNITY): Payer: Self-pay | Admitting: Emergency Medicine

## 2015-02-20 DIAGNOSIS — Z862 Personal history of diseases of the blood and blood-forming organs and certain disorders involving the immune mechanism: Secondary | ICD-10-CM | POA: Insufficient documentation

## 2015-02-20 DIAGNOSIS — Z880 Allergy status to penicillin: Secondary | ICD-10-CM | POA: Insufficient documentation

## 2015-02-20 DIAGNOSIS — J029 Acute pharyngitis, unspecified: Secondary | ICD-10-CM | POA: Insufficient documentation

## 2015-02-20 DIAGNOSIS — K0889 Other specified disorders of teeth and supporting structures: Secondary | ICD-10-CM

## 2015-02-20 DIAGNOSIS — Z79899 Other long term (current) drug therapy: Secondary | ICD-10-CM | POA: Insufficient documentation

## 2015-02-20 DIAGNOSIS — Z87891 Personal history of nicotine dependence: Secondary | ICD-10-CM | POA: Insufficient documentation

## 2015-02-20 DIAGNOSIS — J45909 Unspecified asthma, uncomplicated: Secondary | ICD-10-CM | POA: Insufficient documentation

## 2015-02-20 DIAGNOSIS — K088 Other specified disorders of teeth and supporting structures: Secondary | ICD-10-CM | POA: Insufficient documentation

## 2015-02-20 LAB — RAPID STREP SCREEN (MED CTR MEBANE ONLY): Streptococcus, Group A Screen (Direct): NEGATIVE

## 2015-02-20 MED ORDER — CLINDAMYCIN HCL 300 MG PO CAPS
300.0000 mg | ORAL_CAPSULE | Freq: Once | ORAL | Status: AC
  Administered 2015-02-20: 300 mg via ORAL
  Filled 2015-02-20: qty 1

## 2015-02-20 MED ORDER — BUPIVACAINE HCL (PF) 0.5 % IJ SOLN
10.0000 mL | Freq: Once | INTRAMUSCULAR | Status: AC
  Administered 2015-02-20: 10 mL
  Filled 2015-02-20: qty 30

## 2015-02-20 MED ORDER — MELOXICAM 15 MG PO TABS: 15.0000 mg | ORAL_TABLET | Freq: Every day | ORAL | Status: AC

## 2015-02-20 MED ORDER — NAPROXEN 500 MG PO TABS
500.0000 mg | ORAL_TABLET | Freq: Once | ORAL | Status: AC
  Administered 2015-02-20: 500 mg via ORAL
  Filled 2015-02-20: qty 1

## 2015-02-20 MED ORDER — CLINDAMYCIN HCL 150 MG PO CAPS: 300.0000 mg | ORAL_CAPSULE | Freq: Three times a day (TID) | ORAL | Status: DC

## 2015-02-20 NOTE — ED Notes (Signed)
Pt has toothache on right side, upper jaw, on-going for two days now. Pt states her jaw/throat/and tonsils are all swollen. Tonsils are almost touching. States she can feel a mild impairment in her breathing. Pt not talking because she says it hurts too much to talk.

## 2015-02-20 NOTE — Discharge Instructions (Signed)
Take clindamycin as directed until gone. Take mobic as needed for pain. Refer to attached documents for more information. Follow up with Dr. Russella DarBenitez for further evaluation.

## 2015-02-20 NOTE — ED Provider Notes (Signed)
CSN: [id number]     Arrival date & time 02/20/15  2016 History  This chart was scribed for non-physician practitioner, Emilia BeckKaitlyn Iraida Cragin, PA-C working with Raeford RazorStephen Kohut, MD by Placido SouLogan Joldersma, ED scribe. This patient was seen in room WTR5/WTR5 and the patient's care was started at 10:23 PM.    Chief Complaint  Patient presents with  . Sore Throat  . Dental Pain  . Facial Swelling   The history is provided by the patient. No language interpreter was used.    HPI Comments: Lindsay Roach is a 24 y.o. female who presents to the Emergency Department complaining of constant, moderate, right upper tooth and throat pain with onset 1 day ago. Pt notes associated swelling to her jaw, throat and tonsils as well as mild trouble breathing. She notes a worsening of her symptoms when speaking and further notes taking 600 mg ibuprofen with no relief of her symptoms. Pt doesn't currently have a dental provider. She denies fever, chills or diaphoresis.  Past Medical History  Diagnosis Date  . Asthma   . Allergy   . Anemia   . 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 No pertinent past surgical history. History reviewed. No pertinent family history. History  Substance Use Topics  . Smoking status: Former Smoker    Types: Cigarettes    Quit date: 05/08/2011  . Smokeless tobacco: Never Used  . Alcohol Use: No   OB History    No data available     Review of Systems  HENT: Positive for dental problem, facial swelling and sore throat.   All other systems reviewed and are negative.  Allergies  Tramadol and Penicillins  Home Medications   Prior to Admission medications   Medication Sig Start Date End Date Taking? Authorizing Provider  albuterol (PROVENTIL HFA;VENTOLIN HFA) 108 (90 BASE) MCG/ACT inhaler Inhale 2 puffs into the lungs every 6 (six) hours as needed for wheezing.    Historical Provider, MD  clindamycin (CLEOCIN) 150 MG capsule Take 2 capsules (300 mg total) by mouth 3  (three) times daily. May dispense as 150mg  capsules Patient not taking: Reported on 08/26/2014 09/03/13   Emilia BeckKaitlyn Ikram Riebe, PA-C  HYDROcodone-acetaminophen (NORCO/VICODIN) 5-325 MG per tablet Take 1 tablet by mouth every 6 (six) hours as needed for moderate pain. 12/26/14   Charlestine Nighthristopher Lawyer, PA-C  ibuprofen (ADVIL,MOTRIN) 800 MG tablet Take 1 tablet (800 mg total) by mouth every 8 (eight) hours as needed. 02/01/15   Charlestine Nighthristopher Lawyer, PA-C  naproxen (NAPROSYN) 500 MG tablet Take 1 tablet (500 mg total) by mouth 2 (two) times daily as needed for mild pain, moderate pain or headache (TAKE WITH MEALS.). Patient not taking: Reported on 12/26/2014 10/28/14   Mercedes Camprubi-Soms, PA-C   BP 116/87 mmHg  Pulse 91  Temp(Src) 98.6 F (37 C) (Oral)  Resp 20  SpO2 95%  LMP 01/21/2015 Physical Exam  Constitutional: She is oriented to person, place, and time. She appears well-developed and well-nourished. No distress.  HENT:  Head: Normocephalic and atraumatic.  Mouth/Throat: Oropharynx is clear and moist.  Fair dentition. Right lower molars tender to percussion. No abscess noted. Bilateral tonsillar edema and mild erythema without exudate.   Eyes: Conjunctivae and EOM are normal. Pupils are equal, round, and reactive to light.  Neck: Normal range of motion. Neck supple. No tracheal deviation present.  Cardiovascular: Normal rate.   Pulmonary/Chest: Breath sounds normal. No respiratory distress.  Abdominal: Soft. She exhibits no distension. There is no  tenderness. There is no rebound.  Musculoskeletal: Normal range of motion.  Neurological: She is alert and oriented to person, place, and time.  Skin: Skin is warm and dry.  Psychiatric: She has a normal mood and affect. Her behavior is normal.  Nursing note and vitals reviewed.   ED Course  NERVE BLOCK Date/Time: 02/20/2015 10:57 PM Performed by: Emilia Beck Authorized by: Emilia Beck Consent: Verbal consent obtained. Consent  given by: patient Patient understanding: patient states understanding of the procedure being performed Patient consent: the patient's understanding of the procedure matches consent given Patient identity confirmed: verbally with patient Time out: Immediately prior to procedure a "time out" was called to verify the correct patient, procedure, equipment, support staff and site/side marked as required. Indications: pain relief Body area: face/mouth Nerve: inferior alveolar Laterality: right Patient sedated: no Patient position: sitting Needle gauge: 25 G Location technique: anatomical landmarks Local anesthetic: bupivacaine 0.5% without epinephrine Anesthetic total: 2 ml Outcome: pain improved Patient tolerance: Patient tolerated the procedure well with no immediate complications    DIAGNOSTIC STUDIES: Oxygen Saturation is 95% on RA, adequate by my interpretation.    COORDINATION OF CARE: 10:28 PM Discussed treatment plan with pt at bedside and pt agreed to plan.  Labs Review Labs Reviewed - No data to display  Imaging Review No results found.   EKG Interpretation None      MDM   Final diagnoses:  Pain, dental    10:56 PM No signs of ludwigs angina. Patient given dental block which improved her pain. Patient will have clindamycin and mobic and instructions to follow up with the recommended dentist.   I personally performed the services described in this documentation, which was scribed in my presence. The recorded information has been reviewed and is accurate.    Emilia Beck, PA-C 02/20/15 2259  Raeford Razor, MD 02/21/15 [phone]

## 2015-02-22 ENCOUNTER — Telehealth: Payer: Medicaid Other | Admitting: Family

## 2015-02-22 DIAGNOSIS — J069 Acute upper respiratory infection, unspecified: Secondary | ICD-10-CM

## 2015-02-22 MED ORDER — BENZONATATE 100 MG PO CAPS: 100.0000 mg | ORAL_CAPSULE | Freq: Three times a day (TID) | ORAL | Status: AC

## 2015-02-22 NOTE — Progress Notes (Signed)
We are sorry that you are not feeling well.  Here is how we plan to help!  Based on what you have shared with me it looks like you have upper respiratory tract inflammation that has resulted in a significant cough.  Inflammation and infection in the upper respiratory tract is commonly called bronchitis and has four common causes:  Allergies, Viral Infections, Acid Reflux and Bacterial Infections.  Allergies, viruses and acid reflux are treated by controlling symptoms or eliminating the cause. An example might be a cough caused by taking certain blood pressure medications. You stop the cough by changing the medication. Another example might be a cough caused by acid reflux. Controlling the reflux helps control the cough.  Based on your presentation I believe you most likely have A cough due to allergies.  I recommend that you start the an over-the counter-allergy medication such as Claritin 10 mg or Zyrtec 10 mg daily.    Also, please make sure you follow up with your dentist.    In addition you may use A non-prescription cough medication called Robitussin DAC. Take 2 teaspoons every 8 hours or Delsym: take 2 teaspoons every 12 hours., A non-prescription cough medication called Mucinex DM: take 2 tablets every 12 hours. and A prescription cough medication called Tessalon Perles 100mg . You may take 1-2 capsules every 8 hours as needed for your cough.    HOME CARE . Only take medications as instructed by your medical team. . Complete the entire course of an antibiotic. . Drink plenty of fluids and get plenty of rest. . Avoid close contacts especially the very young and the elderly . Cover your mouth if you cough or cough into your sleeve. . Always remember to wash your hands . A steam or ultrasonic humidifier can help congestion.    GET HELP RIGHT AWAY IF: . You develop worsening fever. . You become short of breath . You cough up blood. . Your symptoms persist after you have completed your  treatment plan MAKE SURE YOU   Understand these instructions.  Will watch your condition.  Will get help right away if you are not doing well or get worse.  Your e-visit answers were reviewed by a board certified advanced clinical practitioner to complete your personal care plan.  Depending on the condition, your plan could have included both over the counter or prescription medications.  If there is a problem please reply  once you have received a response from your provider.  Your safety is important to us.  If you have drug allergies check your prescription carefully.    You can use MyChart to ask questions about today's visit, request a non-urgent call back, or ask for a work or school excuse.  You will get an e-mail in the next two days asking about your experience.  I hope that your e-visit has been valuable and will speed your recovery. Thank you for using e-visits.

## 2015-02-23 LAB — CULTURE, GROUP A STREP

## 2016-04-13 ENCOUNTER — Encounter (HOSPITAL_COMMUNITY): Payer: Self-pay | Admitting: *Deleted

## 2016-04-13 ENCOUNTER — Emergency Department (HOSPITAL_COMMUNITY)
Admission: EM | Admit: 2016-04-13 | Discharge: 2016-04-14 | Disposition: A | Discharge status: Home / Self Care | Payer: Medicaid Other | Attending: Emergency Medicine | Admitting: Emergency Medicine

## 2016-04-13 DIAGNOSIS — Z791 Long term (current) use of non-steroidal anti-inflammatories (NSAID): Secondary | ICD-10-CM | POA: Insufficient documentation

## 2016-04-13 DIAGNOSIS — J45909 Unspecified asthma, uncomplicated: Secondary | ICD-10-CM | POA: Insufficient documentation

## 2016-04-13 DIAGNOSIS — K0889 Other specified disorders of teeth and supporting structures: Secondary | ICD-10-CM | POA: Insufficient documentation

## 2016-04-13 DIAGNOSIS — Z79899 Other long term (current) drug therapy: Secondary | ICD-10-CM | POA: Insufficient documentation

## 2016-04-13 DIAGNOSIS — Z87891 Personal history of nicotine dependence: Secondary | ICD-10-CM | POA: Insufficient documentation

## 2016-04-13 MED ORDER — HYDROCODONE-ACETAMINOPHEN 5-325 MG PO TABS: 1.0000 | ORAL_TABLET | Freq: Two times a day (BID) | ORAL | 0 refills | Status: DC | PRN

## 2016-04-13 MED ORDER — CLINDAMYCIN HCL 300 MG PO CAPS: 300.0000 mg | ORAL_CAPSULE | Freq: Three times a day (TID) | ORAL | 0 refills | Status: DC

## 2016-04-13 MED ORDER — CLINDAMYCIN HCL 300 MG PO CAPS
300.0000 mg | ORAL_CAPSULE | Freq: Once | ORAL | Status: AC
  Administered 2016-04-13: 300 mg via ORAL
  Filled 2016-04-13: qty 1

## 2016-04-13 MED ORDER — HYDROCODONE-ACETAMINOPHEN 5-325 MG PO TABS
1.0000 | ORAL_TABLET | Freq: Once | ORAL | Status: AC
  Administered 2016-04-13: 1 via ORAL
  Filled 2016-04-13: qty 1

## 2016-04-13 MED ORDER — KETOROLAC TROMETHAMINE 60 MG/2ML IM SOLN
60.0000 mg | Freq: Once | INTRAMUSCULAR | Status: AC
  Administered 2016-04-13: 60 mg via INTRAMUSCULAR
  Filled 2016-04-13: qty 2

## 2016-04-13 NOTE — ED Triage Notes (Signed)
Pt complains of left lower molar pain. Pt states she has has broken left molar and premolars for the past year.

## 2016-04-13 NOTE — ED Notes (Signed)
Pt has lower left cracked molar. Pt states she has not seen her dentist for this

## 2016-04-13 NOTE — ED Provider Notes (Signed)
[facility]-EMERGENCY DEPT Provider Note   CSN: [id number] Arrival date & time: 04/13/16  2039     History   Chief Complaint No chief complaint on file.   HPI Achille RichDenia C Tobey is a 25 y.o. female no sig PMH, here with dental pain off and on for the past several months.  She states her pain has become  Unbearable.  She has been trying over-the-counter medication without any relief. She does not have dental follow-up and is requesting a referral. She denies any fevers. There is swelling. She denies any drainage. There are no further complaints.  10 Systems reviewed and are negative for acute change except as noted in the HPI.    HPI  Past Medical History:  Diagnosis Date  . Allergy   . Anemia   . Asthma   . 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 Sleep disturbance 09/07/2011  . Back pain 09/07/2011  . Knee pain 09/07/2011  . Obesity 09/07/2011    No past surgical history on file.  OB History    No data available       Home Medications    Prior to Admission medications   Medication Sig Start Date End Date Taking? Authorizing Provider  albuterol (PROVENTIL HFA;VENTOLIN HFA) 108 (90 BASE) MCG/ACT inhaler Inhale 2 puffs into the lungs every 6 (six) hours as needed for wheezing.    Historical Provider, MD  benzonatate (TESSALON) 100 MG capsule Take 1 capsule (100 mg total) by mouth 3 (three) times daily. 02/22/15   Junie Spencerhristy A Hawks, FNP  clindamycin (CLEOCIN) 150 MG capsule Take 2 capsules (300 mg total) by mouth 3 (three) times daily. May dispense as 150mg  capsules 02/20/15   Emilia BeckKaitlyn Szekalski, PA-C  HYDROcodone-acetaminophen (NORCO/VICODIN) 5-325 MG per tablet Take 1 tablet by mouth every 6 (six) hours as needed for moderate pain. 12/26/14   Charlestine Nighthristopher Lawyer, PA-C  ibuprofen (ADVIL,MOTRIN) 800 MG tablet Take 1 tablet (800 mg total) by mouth every 8 (eight) hours as needed. 02/01/15   Charlestine Nighthristopher Lawyer, PA-C  meloxicam (MOBIC) 15 MG  tablet Take 1 tablet (15 mg total) by mouth daily. 02/20/15   Kaitlyn Szekalski, PA-C  naproxen (NAPROSYN) 500 MG tablet Take 1 tablet (500 mg total) by mouth 2 (two) times daily as needed for mild pain, moderate pain or headache (TAKE WITH MEALS.). Patient not taking: Reported on 12/26/2014 10/28/14   Mercedes Camprubi-Soms, PA-C    Family History No family history on file.  Social History Social History  Substance Use Topics  . Smoking status: Former Smoker    Types: Cigarettes    Quit date: 05/08/2011  . Smokeless tobacco: Never Used  . Alcohol use No     Allergies   Tramadol and Penicillins   Review of Systems Review of Systems   Physical Exam Updated Vital Signs BP 138/58 (BP Location: Right Arm)   Pulse 96   Temp 98.2 F (36.8 C) (Oral)   Resp 18   SpO2 96%   Physical Exam  Constitutional: She is oriented to person, place, and time. She appears well-developed and well-nourished. No distress.  HENT:  Head: Normocephalic and atraumatic.  Nose: Nose normal.  Mouth/Throat: Oropharynx is clear and moist. No oropharyngeal exudate.  Overall poor dentition. Fracture is seen at #14. Diffuse swelling around the tooth. No abscess formation.  Eyes: Conjunctivae and EOM are normal. Pupils are equal, round, and reactive to light. No scleral icterus.  Neck: Normal range of motion. Neck supple. No JVD present. No tracheal deviation present. No thyromegaly present.  Cardiovascular: Normal rate, regular rhythm and normal heart sounds.  Exam reveals no gallop and no friction rub.   No murmur heard. Pulmonary/Chest: Effort normal and breath sounds normal. No respiratory distress. She has no wheezes. She exhibits no tenderness.  Abdominal: Soft. Bowel sounds are normal. She exhibits no distension and no mass. There is no tenderness. There is no rebound and no guarding.  Musculoskeletal: Normal range of motion. She exhibits no edema or tenderness.  Lymphadenopathy:    She has no  cervical adenopathy.  Neurological: She is alert and oriented to person, place, and time. No cranial nerve deficit. She exhibits normal muscle tone.  Skin: Skin is warm and dry. No rash noted. No erythema. No pallor.  Nursing note and vitals reviewed.    ED Treatments / Results  Labs (all labs ordered are listed, but only abnormal results are displayed) Labs Reviewed - No data to display  EKG  EKG Interpretation None       Radiology No results found.  Procedures Procedures (including critical care time)  Medications Ordered in ED Medications  ketorolac (TORADOL) injection 60 mg (not administered)  HYDROcodone-acetaminophen (NORCO/VICODIN) 5-325 MG per tablet 1 tablet (not administered)  clindamycin (CLEOCIN) capsule 300 mg (not administered)     Initial Impression / Assessment and Plan / ED Course  I have reviewed the triage vital signs and the nursing notes.  Pertinent labs & imaging results that were available during my care of the patient were reviewed by me and considered in my medical decision making (see chart for details).  Clinical Course    Patient presents to the ED for dental pain.  There is local area of swelling concerning for infection.  Will give clindamycin (she is allergic to PCN), toradol and norco for pain control.  Dental follow-up provided as well. She appears well in no acute distress, vital signs were within her normal limits and she is safe for discharge.  Final Clinical Impressions(s) / ED Diagnoses   Final diagnoses:  None    New Prescriptions New Prescriptions   No medications on file     Tomasita Crumble, MD 04/13/16 2315

## 2016-10-26 ENCOUNTER — Emergency Department (HOSPITAL_COMMUNITY): Payer: Medicaid Other

## 2016-10-26 ENCOUNTER — Encounter (HOSPITAL_COMMUNITY): Payer: Self-pay | Admitting: Emergency Medicine

## 2016-10-26 ENCOUNTER — Emergency Department (HOSPITAL_COMMUNITY)
Admission: EM | Admit: 2016-10-26 | Discharge: 2016-10-26 | Disposition: A | Discharge status: Home / Self Care | Payer: Medicaid Other | Attending: Emergency Medicine | Admitting: Emergency Medicine

## 2016-10-26 DIAGNOSIS — Z87891 Personal history of nicotine dependence: Secondary | ICD-10-CM | POA: Insufficient documentation

## 2016-10-26 DIAGNOSIS — J45909 Unspecified asthma, uncomplicated: Secondary | ICD-10-CM | POA: Insufficient documentation

## 2016-10-26 DIAGNOSIS — J351 Hypertrophy of tonsils: Secondary | ICD-10-CM | POA: Insufficient documentation

## 2016-10-26 DIAGNOSIS — J020 Streptococcal pharyngitis: Secondary | ICD-10-CM | POA: Insufficient documentation

## 2016-10-26 LAB — BASIC METABOLIC PANEL
Anion gap: 9 (ref 5–15)
BUN: 7 mg/dL (ref 6–20)
CO2: 26 mmol/L (ref 22–32)
CREATININE: 0.69 mg/dL (ref 0.44–1.00)
Calcium: 9.4 mg/dL (ref 8.9–10.3)
Chloride: 103 mmol/L (ref 101–111)
GFR calc Af Amer: >60 mL/min (ref >60)
GFR calc non Af Amer: >60 mL/min (ref >60)
Glucose, Bld: 90 mg/dL (ref 65–99)
Potassium: 3.5 mmol/L (ref 3.5–5.1)
Sodium: 138 mmol/L (ref 135–145)

## 2016-10-26 LAB — I-STAT BETA HCG BLOOD, ED (MC, WL, AP ONLY): I-stat hCG, quantitative: <5 m[IU]/mL (ref <5)

## 2016-10-26 LAB — CBC WITH DIFFERENTIAL/PLATELET
Basophils Absolute: 0 10*3/uL (ref 0.0–0.1)
Basophils Relative: 0 %
EOS ABS: 0.2 10*3/uL (ref 0.0–0.7)
Eosinophils Relative: 1 %
HCT: 34.5 % — ABNORMAL LOW (ref 36.0–46.0)
Hemoglobin: 11.2 g/dL — ABNORMAL LOW (ref 12.0–15.0)
Lymphocytes Relative: 11 %
Lymphs Abs: 1.8 10*3/uL (ref 0.7–4.0)
MCH: 29.6 pg (ref 26.0–34.0)
MCHC: 32.5 g/dL (ref 30.0–36.0)
MCV: 91 fL (ref 78.0–100.0)
MONOS PCT: 6 %
Monocytes Absolute: 1 10*3/uL (ref 0.1–1.0)
Neutro Abs: 12.7 10*3/uL — ABNORMAL HIGH (ref 1.7–7.7)
Neutrophils Relative %: 82 %
Platelets: 282 10*3/uL (ref 150–400)
RBC: 3.79 MIL/uL — ABNORMAL LOW (ref 3.87–5.11)
RDW: 15.1 % (ref 11.5–15.5)
WBC: 15.6 10*3/uL — AB (ref 4.0–10.5)

## 2016-10-26 LAB — RAPID STREP SCREEN (MED CTR MEBANE ONLY): Streptococcus, Group A Screen (Direct): POSITIVE — AB

## 2016-10-26 LAB — I-STAT CG4 LACTIC ACID, ED: LACTIC ACID, VENOUS: 0.95 mmol/L (ref 0.5–1.9)

## 2016-10-26 MED ORDER — DEXAMETHASONE SODIUM PHOSPHATE 10 MG/ML IJ SOLN
10.0000 mg | Freq: Once | INTRAMUSCULAR | Status: AC
  Administered 2016-10-26: 10 mg via INTRAVENOUS
  Filled 2016-10-26: qty 1

## 2016-10-26 MED ORDER — AZITHROMYCIN 200 MG/5ML PO SUSR: 500.0000 mg | Freq: Every day | ORAL | 0 refills | Status: AC

## 2016-10-26 MED ORDER — IOPAMIDOL (ISOVUE-300) INJECTION 61%
INTRAVENOUS | Status: AC
  Administered 2016-10-26: 75 mL via INTRAVENOUS
  Filled 2016-10-26: qty 75

## 2016-10-26 MED ORDER — HYDROCODONE-ACETAMINOPHEN 7.5-325 MG/15ML PO SOLN: 10.0000 mL | Freq: Four times a day (QID) | ORAL | 0 refills | Status: AC | PRN

## 2016-10-26 MED ORDER — SODIUM CHLORIDE 0.9 % IV BOLUS (SEPSIS)
1000.0000 mL | Freq: Once | INTRAVENOUS | Status: AC
  Administered 2016-10-26: 1000 mL via INTRAVENOUS

## 2016-10-26 MED ORDER — CLINDAMYCIN PHOSPHATE 600 MG/50ML IV SOLN
600.0000 mg | Freq: Once | INTRAVENOUS | Status: AC
  Administered 2016-10-26: 600 mg via INTRAVENOUS
  Filled 2016-10-26: qty 50

## 2016-10-26 MED ORDER — ACETAMINOPHEN 325 MG PO TABS: 650.0000 mg | ORAL_TABLET | Freq: Once | ORAL | Status: DC | PRN

## 2016-10-26 NOTE — ED Notes (Signed)
Dr.Long notified of pt still reporting pain 10/10 and wanting to know what the disposition status would be. Dr.Long advised he would speak with pt about plan of care.

## 2016-10-26 NOTE — ED Triage Notes (Signed)
Pt presents with a sore throat.  Pt unable to talk well.  Pt febrile upon assessment.  Pt's throat very swollen around both tonsils.  White/yellow coating noted.  Airway intact at this time.

## 2016-10-26 NOTE — ED Provider Notes (Signed)
Emergency Department Provider Note   I have reviewed the triage vital signs and the nursing notes.   HISTORY  Chief Complaint Sore Throat and Fever   HPI Lindsay Roach is a 26 y.o. female with PMH of asthma presents to the emergency department for evaluation of severe sore throat, difficulty talking, difficulty breathing, and fever that started today. Patient states the sore throat began 2 days ago and rapidly progressed to the stapes in today. She notes that her voice is muffled and she is having difficulty lying flat because of breathing difficulties. She is also experiencing some discomfort in her left lateral neck and left ear. No headaches. No rigors. No modifying factors.    Past Medical History:  Diagnosis Date  . Allergy   . Anemia   . Asthma   . 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 Sleep disturbance 09/07/2011  . Back pain 09/07/2011  . Knee pain 09/07/2011  . Obesity 09/07/2011    History reviewed. No pertinent surgical history.  Current Outpatient Rx  . Order #: [id number] Class: Print  . Order #: [id number] Class: Normal  . Order #: [id number] Class: Print  . Order #: [id number] Class: Print  . Order #: [id number] Class: Print  . Order #: [id number] Class: Print  . Order #: [id number] Class: Print    Allergies Tramadol and Penicillins  No family history on file.  Social History Social History  Substance Use Topics  . Smoking status: Former Smoker    Types: Cigarettes    Quit date: 05/08/2011  . Smokeless tobacco: Never Used  . Alcohol use No    Review of Systems  Constitutional: No fever/chills Eyes: No visual changes. ENT: Positive sore throat. Cardiovascular: Denies chest pain. Respiratory: Denies shortness of breath. Gastrointestinal: No abdominal pain.  No nausea, no vomiting.  No diarrhea.  No constipation. Genitourinary: Negative for dysuria. Musculoskeletal: Negative for back pain. Skin:  Negative for rash. Neurological: Negative for headaches, focal weakness or numbness.  10-point ROS otherwise negative.  ____________________________________________   PHYSICAL EXAM:  VITAL SIGNS: ED Triage Vitals  Enc Vitals Group     BP 10/26/16 1550 (!) 159/92     Pulse Rate 10/26/16 1550 (!) 110     Resp 10/26/16 1550 20     Temp 10/26/16 1550 (!) 101.1 F (38.4 C)     Temp Source 10/26/16 1550 Oral     SpO2 10/26/16 1550 98 %     Weight 10/26/16 1550 253 lb (114.8 kg)     Height 10/26/16 1550 5\' 4"  (1.626 m)     Pain Score 10/26/16 1551 10   Constitutional: Alert and oriented. Anxious-appearing.  Eyes: Conjunctivae are normal. Head: Atraumatic. Nose: No congestion/rhinnorhea. Mouth/Throat: Mucous membranes are moist. Bilateral tonsillar crowding with significant exudate. No PTA. Muffled voice but no trismus. Patient is managing oral secretions. Soft submandibular space. Mild tenderness to palpation of left neck.  Neck: No stridor. Cardiovascular: Sinus tachycardia. Good peripheral circulation. Grossly normal heart sounds.   Respiratory: Normal respiratory effort.  No retractions. Lungs CTAB. Gastrointestinal: Soft and nontender. No distention.  Musculoskeletal: No lower extremity tenderness nor edema. No gross deformities of extremities. Neurologic:  Normal speech and language. No gross focal neurologic deficits are appreciated.  Skin:  Skin is warm, dry and intact. No rash noted. Psychiatric: Mood and affect are normal. Speech and behavior are normal.  ____________________________________________   LABS (all labs ordered are listed, but only  abnormal results are displayed)  Labs Reviewed  RAPID STREP SCREEN (NOT AT [facility]) - Abnormal; Notable for the following:       Result Value   Streptococcus, Group A Screen (Direct) POSITIVE (*)    All other components within normal limits  CBC WITH DIFFERENTIAL/PLATELET - Abnormal; Notable for the following:    WBC 15.6 (*)      RBC 3.79 (*)    Hemoglobin 11.2 (*)    HCT 34.5 (*)    Neutro Abs 12.7 (*)    All other components within normal limits  CULTURE, BLOOD (ROUTINE X 2)  CULTURE, BLOOD (ROUTINE X 2)  BASIC METABOLIC PANEL  I-STAT CG4 LACTIC ACID, ED  I-STAT BETA HCG BLOOD, ED ([facility], [facility], [facility] ONLY)   ____________________________________________  RADIOLOGY  Ct Soft Tissue Neck W Contrast  Result Date: 10/26/2016 CLINICAL DATA:  Initial evaluation for acute sore throat, febrile, tonsillar swelling. EXAM: CT NECK WITH CONTRAST TECHNIQUE: Multidetector CT imaging of the neck was performed using the standard protocol following the bolus administration of intravenous contrast. CONTRAST:  75mL ISOVUE-300 IOPAMIDOL (ISOVUE-300) INJECTION 61% COMPARISON:  None available. FINDINGS: Pharynx and larynx: Oral cavity within normal limits. Scattered dental caries noted. No acute abnormality about the dentition. Palatine tonsils are enlarged and hyperenhancing, suggesting acute tonsillitis. No tonsillar or peritonsillar abscess. Adenoidal soft tissues prominent as well. Mild associated induration within the left parapharyngeal fat. Right parapharyngeal 5 preserved. Remainder of the oropharynx within normal limits. Epiglottis normal. Vallecula clear. Retropharyngeal soft tissues within normal limits. Remainder of the hypopharynx and supraglottic larynx within normal limits. True cords symmetric and normal. Subglottic airway clear. Salivary glands: Salivary glands and parotid glands within normal limits. Thyroid: Thyroid normal. Lymph nodes: Mildly prominent level 2 lymph nodes measure up to 1 cm on the left and 1.4 cm on the right, likely reactive. No other pathologically enlarged lymph nodes identified within the neck. Vascular: Normal intravascular enhancement seen throughout the neck. Carotid artery is partially medialized into the retropharyngeal space. Limited intracranial: Unremarkable. Visualized orbits: Not well evaluated on  this exam. Mastoids and visualized paranasal sinuses: Mild scattered mucosal thickening within the visualized ethmoidal air cells. Paranasal sinuses are otherwise clear. No mastoid effusion. Middle ear cavities clear. Skeleton: No acute osseous abnormality. No worrisome lytic or blastic osseous lesions. Upper chest: Visualized upper mediastinum within normal limits. Partially visualized lungs are clear. Other: No other significant finding. IMPRESSION: 1. Symmetric enlargement of the palatine tonsils bilaterally, suggesting acute tonsillitis. No tonsillar or peritonsillar abscess. No drainable fluid collection. 2. Mildly prominent bilateral level 2 lymph nodes, likely reactive. 3. Scattered dental caries. Electronically Signed   By: Rise MuBenjamin  McClintock M.D.   On: 10/26/2016 18:38    ____________________________________________   PROCEDURES  Procedure(s) performed:   Procedures  None ____________________________________________   INITIAL IMPRESSION / ASSESSMENT AND PLAN / ED COURSE  Pertinent labs & imaging results that were available during my care of the patient were reviewed by me and considered in my medical decision making (see chart for details).  Patient presents to the emergency department for evaluation of severe sore throat, fever, difficulty breathing with muffled voice. My initial evaluation the patient is managing oral secretions and is in no acute respiratory distress. She has bilateral tonsillar hypertrophy. No evidence of peritonsillar abscess. She also had some tenderness underneath the mandible on the left. No concern for immediate airway compromise. Much of her muffled voice is likely from the tonsillar hypertrophy rather than epiglottitis. Given her concerning symptoms  I do plan to follow with CT scan of the neck. We will obtain labs as the patient is febrile, tachycardic, tachypnea, arrival. Plan to give Decadron and start IV clindamycin in the interim.  CT neck with no  deep space infection or abscess. Patient is tolerating oral fluids. Voice is muffled but is secondary to tonsillar crowding and hypertrophy. Provided liquid abx for strep positive tonsillitis as well as Hycet for pain relief. Discussed importance of PO hydration. Will follow with PCP. Discussed return precautions in detail with patient and father at bedside who are comfortable with plan for discharge.   At this time, I do not feel there is any life-threatening condition present. I have reviewed and discussed all results (EKG, imaging, lab, urine as appropriate), exam findings with patient. I have reviewed nursing notes and appropriate previous records.  I feel the patient is safe to be discharged home without further emergent workup. Discussed usual and customary return precautions. Patient and family (if present) verbalize understanding and are comfortable with this plan.  Patient will follow-up with their primary care provider. If they do not have a primary care provider, information for follow-up has been provided to them. All questions have been answered.  ____________________________________________  FINAL CLINICAL IMPRESSION(S) / ED DIAGNOSES  Final diagnoses:  Strep pharyngitis  Tonsillar hypertrophy     MEDICATIONS GIVEN DURING THIS VISIT:  Medications  sodium chloride 0.9 % bolus 1,000 mL (0 mLs Intravenous Stopped 10/26/16 1833)  dexamethasone (DECADRON) injection 10 mg (10 mg Intravenous Given 10/26/16 1651)  clindamycin (CLEOCIN) IVPB 600 mg (0 mg Intravenous Stopped 10/26/16 1721)  iopamidol (ISOVUE-300) 61 % injection (75 mLs Intravenous Contrast Given 10/26/16 1816)     NEW OUTPATIENT MEDICATIONS STARTED DURING THIS VISIT:  Discharge Medication List as of 10/26/2016  8:42 PM    START taking these medications   Details  azithromycin (ZITHROMAX) 200 MG/5ML suspension Take 12.5 mLs (500 mg total) by mouth daily., Starting Wed 10/26/2016, Until Mon 10/31/2016, Print     HYDROcodone-acetaminophen (HYCET) 7.5-325 mg/15 ml solution Take 10 mLs by mouth every 6 (six) hours as needed for severe pain., Starting Wed 10/26/2016, Print          Note:  This document was prepared using Dragon voice recognition software and may include unintentional dictation errors.  Alona Bene, MD Emergency Medicine   Maia Plan, MD 10/27/16 [phone]

## 2016-10-26 NOTE — ED Notes (Signed)
Pt given water to attempt PO fluid challenge. Pt able to swallow without difficulty. Pt did report pain on swallowing.

## 2016-10-26 NOTE — Discharge Instructions (Signed)
You were seen in the ED today with strep throat. Your tonsils are very large. I have given you steroids in the ED that will continue to provide some relief. I have given you an antibiotic to take for the next 5 days. Complete the medication without fail. I also prescribed some pain medication to only use with severe pain.   Return to the ED immediately if you have any trouble breathing, can't swallow liquids, or have other concerning symptoms to you.

## 2016-10-31 LAB — CULTURE, BLOOD (ROUTINE X 2): Culture: NO GROWTH

## 2017-05-01 ENCOUNTER — Emergency Department (HOSPITAL_COMMUNITY)
Admission: EM | Admit: 2017-05-01 | Discharge: 2017-05-01 | Disposition: A | Discharge status: Home / Self Care | Payer: Self-pay | Attending: Emergency Medicine | Admitting: Emergency Medicine

## 2017-05-01 ENCOUNTER — Encounter (HOSPITAL_COMMUNITY): Payer: Self-pay | Admitting: Family Medicine

## 2017-05-01 DIAGNOSIS — Z87891 Personal history of nicotine dependence: Secondary | ICD-10-CM | POA: Insufficient documentation

## 2017-05-01 DIAGNOSIS — K0889 Other specified disorders of teeth and supporting structures: Secondary | ICD-10-CM | POA: Insufficient documentation

## 2017-05-01 DIAGNOSIS — D649 Anemia, unspecified: Secondary | ICD-10-CM | POA: Insufficient documentation

## 2017-05-01 DIAGNOSIS — J45909 Unspecified asthma, uncomplicated: Secondary | ICD-10-CM | POA: Insufficient documentation

## 2017-05-01 DIAGNOSIS — Z79899 Other long term (current) drug therapy: Secondary | ICD-10-CM | POA: Insufficient documentation

## 2017-05-01 MED ORDER — CLINDAMYCIN HCL 300 MG PO CAPS: 300.0000 mg | ORAL_CAPSULE | Freq: Three times a day (TID) | ORAL | 0 refills | Status: AC

## 2017-05-01 MED ORDER — CLINDAMYCIN HCL 300 MG PO CAPS
300.0000 mg | ORAL_CAPSULE | Freq: Once | ORAL | Status: AC
  Administered 2017-05-01: 300 mg via ORAL
  Filled 2017-05-01: qty 1

## 2017-05-01 MED ORDER — BUPIVACAINE-EPINEPHRINE (PF) 0.5% -1:200000 IJ SOLN
1.8000 mL | Freq: Once | INTRAMUSCULAR | Status: AC
  Administered 2017-05-01: 1.8 mL
  Filled 2017-05-01: qty 1.8

## 2017-05-01 MED ORDER — HYDROCODONE-ACETAMINOPHEN 5-325 MG PO TABS
1.0000 | ORAL_TABLET | Freq: Once | ORAL | Status: AC
  Administered 2017-05-01: 1 via ORAL
  Filled 2017-05-01: qty 1

## 2017-05-01 MED ORDER — IBUPROFEN 200 MG PO TABS
600.0000 mg | ORAL_TABLET | Freq: Once | ORAL | Status: AC
  Administered 2017-05-01: 600 mg via ORAL
  Filled 2017-05-01: qty 3

## 2017-05-01 NOTE — ED Notes (Signed)
Pt was assessed and discharged by [facility]

## 2017-05-01 NOTE — ED Provider Notes (Signed)
[facility]-EMERGENCY DEPT Provider Note   CSN: [id number] Arrival date & time: 05/01/17  1628     History   Chief Complaint Chief Complaint  Patient presents with  . Dental Pain    HPI Lindsay Roach is a 26 y.o. female.  HPI 26 year old African-American female presents to the emergency Department today with complaints of right upper molar dental pain. Patient states the pain started 2 days ago. She states that her wisdom tooth is coming in and causing her pain. She knows that she needs to see a dentist however she has not followed up. Patient with history of dental abscesses that require antibiotic the past. Patient has tried Tylenol at home with little relief. She has taken nothing for the pain prior to arrival today. Patient denies any difficulty breathing or difficulty swallowing. Patient reports subjective fever at home yesterday. She denies any fever today, facial swelling, nausea, emesis. Past Medical History:  Diagnosis Date  . Allergy   . Anemia   . Asthma   . 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 Sleep disturbance 09/07/2011  . Back pain 09/07/2011  . Knee pain 09/07/2011  . Obesity 09/07/2011    History reviewed. No pertinent surgical history.  OB History    No data available       Home Medications    Prior to Admission medications   Medication Sig Start Date End Date Taking? Authorizing Provider  benzonatate (TESSALON) 100 MG capsule Take 1 capsule (100 mg total) by mouth 3 (three) times daily. Patient not taking: Reported on 10/26/2016 02/22/15   Junie Spencer, FNP  clindamycin (CLEOCIN) 300 MG capsule Take 1 capsule (300 mg total) by mouth 3 (three) times daily. X 7 days 05/01/17   Rise Mu, PA-C  HYDROcodone-acetaminophen (HYCET) 7.5-325 mg/15 ml solution Take 10 mLs by mouth every 6 (six) hours as needed for severe pain. 10/26/16   Long, Arlyss Repress, MD  ibuprofen (ADVIL,MOTRIN) 800 MG tablet Take 1  tablet (800 mg total) by mouth every 8 (eight) hours as needed. Patient not taking: Reported on 10/26/2016 02/01/15   Charlestine Night, PA-C  meloxicam (MOBIC) 15 MG tablet Take 1 tablet (15 mg total) by mouth daily. Patient not taking: Reported on 10/26/2016 02/20/15   Emilia Beck, PA-C  naproxen (NAPROSYN) 500 MG tablet Take 1 tablet (500 mg total) by mouth 2 (two) times daily as needed for mild pain, moderate pain or headache (TAKE WITH MEALS.). Patient not taking: Reported on 12/26/2014 10/28/14   [address], [place], [place]    Family History History reviewed. No pertinent family history.  Social History Social History  Substance Use Topics  . Smoking status: Former Smoker    Types: Cigarettes    Quit date: 05/08/2011  . Smokeless tobacco: Never Used  . Alcohol use No     Allergies   Tramadol and Penicillins   Review of Systems Review of Systems  Constitutional: Positive for fever (subjective). Negative for chills.  HENT: Positive for dental problem. Negative for facial swelling and trouble swallowing.   Respiratory: Negative for shortness of breath.   Gastrointestinal: Negative for nausea and vomiting.     Physical Exam Updated Vital Signs BP (!) 154/60 (BP Location: Left Arm)   Pulse 66   Temp 99 F (37.2 C) (Oral)   Resp 17   Ht  (1.626 m)   Wt 129.7 kg (286 lb)   SpO2  98%   BMI 49.09 kg/m   Physical Exam  Constitutional: She appears well-developed and well-nourished. No distress.  HENT:  Head: Normocephalic and atraumatic.  Mouth/Throat: Uvula is midline, oropharynx is clear and moist and mucous membranes are normal. No trismus in the jaw. No uvula swelling.    No facial swelling. Oropharynx is clear without any signs of a deep neck infection. Managing secretions and tolerating airway.  Eyes: Right eye exhibits no discharge. Left eye exhibits no discharge. No scleral icterus.  Neck: Normal range of motion.  Pulmonary/Chest: No respiratory distress.   Musculoskeletal: Normal range of motion.  Neurological: She is alert.  Skin: No pallor.  Psychiatric: Her behavior is normal. Judgment and thought content normal.  Nursing note and vitals reviewed.    ED Treatments / Results  Labs (all labs ordered are listed, but only abnormal results are displayed) Labs Reviewed - No data to display  EKG  EKG Interpretation None       Radiology No results found.  Procedures Dental Block Date/Time: 05/01/2017 9:21 PM Performed by: Rise Mu Authorized by: Demetrios Loll T   Consent:    Consent obtained:  Verbal   Consent given by:  Patient   Risks discussed:  Allergic reaction, hematoma, intravascular injection, infection, nerve damage, pain, unsuccessful block and swelling   Alternatives discussed:  No treatment Indications:    Indications: dental pain   Location:    Block type:  Supraperiosteal   Supraperiosteal location:  Upper teeth   Upper teeth location:  1/RU 3rd molar Procedure details (see MAR for exact dosages):    Needle gauge:  27 G   Anesthetic injected:  Bupivacaine 0.5% WITH epi   Injection procedure:  Anatomic landmarks identified, anatomic landmarks palpated, negative aspiration for blood, introduced needle and incremental injection Post-procedure details:    Outcome:  Pain relieved   Patient tolerance of procedure:  Tolerated well, no immediate complications   (including critical care time)  Medications Ordered in ED Medications  ibuprofen (ADVIL,MOTRIN) tablet 600 mg (600 mg Oral Given 05/01/17 2109)  HYDROcodone-acetaminophen (NORCO/VICODIN) 5-325 MG per tablet 1 tablet (1 tablet Oral Given 05/01/17 2109)  bupivacaine-epinephrine (MARCAINE W/ EPI) 0.5% -1:200000 injection 1.8 mL (1.8 mLs Infiltration Given by Other 05/01/17 2111)  clindamycin (CLEOCIN) capsule 300 mg (300 mg Oral Given 05/01/17 2109)     Initial Impression / Assessment and Plan / ED Course  I have reviewed the triage vital  signs and the nursing notes.  Pertinent labs & imaging results that were available during my care of the patient were reviewed by me and considered in my medical decision making (see chart for details).     Patient with toothache.  No gross abscess.  Exam unconcerning for Ludwig's angina or spread of infection.  Will treat with clindamycine and pain medicine given allergy to pnc.  Urged patient to follow-up with dentist.     Final Clinical Impressions(s) / ED Diagnoses   Final diagnoses:  Pain, dental    New Prescriptions Current Discharge Medication List       Wallace Keller 05/01/17 2122    Jacalyn Lefevre, MD 05/01/17 2229

## 2017-05-01 NOTE — ED Triage Notes (Signed)
Patient is complaining of right upper dental pain that started 2 days ago. Patient has took TYLENOL with no relief.

## 2017-05-01 NOTE — Discharge Instructions (Signed)
600 mg of ibuprofen or Motrin every 4-6 hours. May take 500 mg of Tylenol every 4 hours. Take the antibiotic as prescribed. Follow-up with a dentist.  Given your dentist resource guide and a free dental clinic in the area.

## 2017-10-30 IMAGING — CT CT NECK W/ CM
3 of 4 series · 13 of 33 positions shown, 16 images · IV contrast (iopamidol)
Comparison: None available.

CLINICAL DATA: Initial evaluation for acute sore throat, febrile,
tonsillar swelling.

EXAM:
CT NECK WITH CONTRAST
TECHNIQUE: Multidetector CT imaging of the neck was performed using the
standard protocol following the bolus administration of intravenous
contrast.
CONTRAST:  75mL YIK21B-ZII IOPAMIDOL (YIK21B-ZII) INJECTION 61%

[Series 3: coronal st · coronal · 0.42mm/px · 3 of 101 slices shown]
[im 21/101  bone]
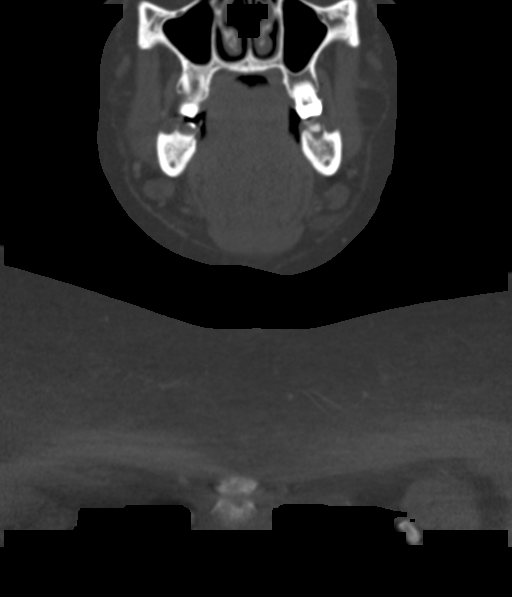
[im 41/101  bone]
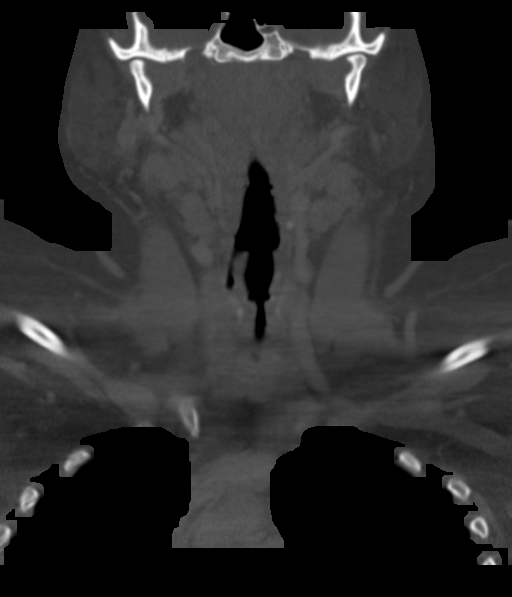
[im 61/101  bone]
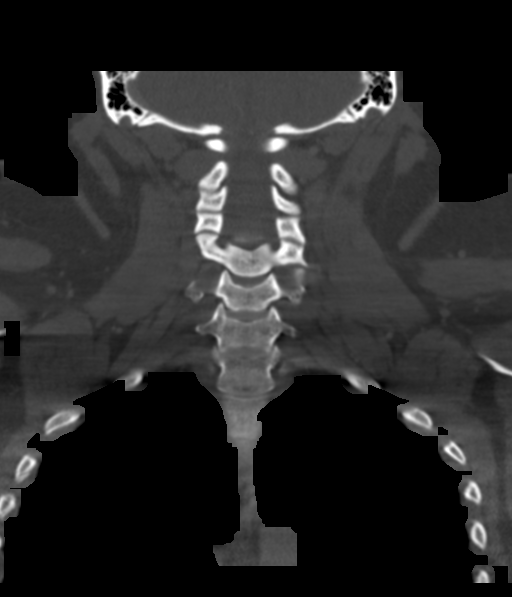

[Series 4: sagittal st · sagittal · 0.45mm/px · 5 of 101 slices shown, 6 images]
[im 34/101  bone]
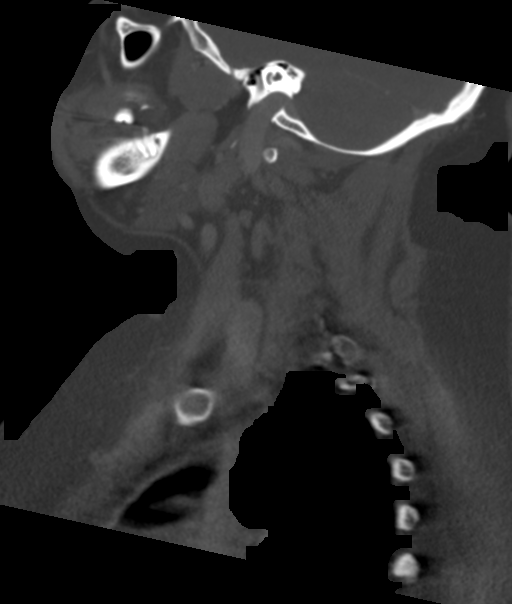
[im 42/101  bone]
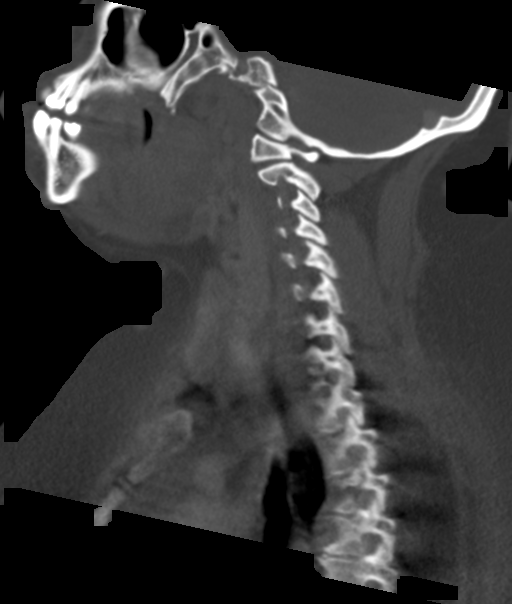
[im 51/101  soft-tissue]
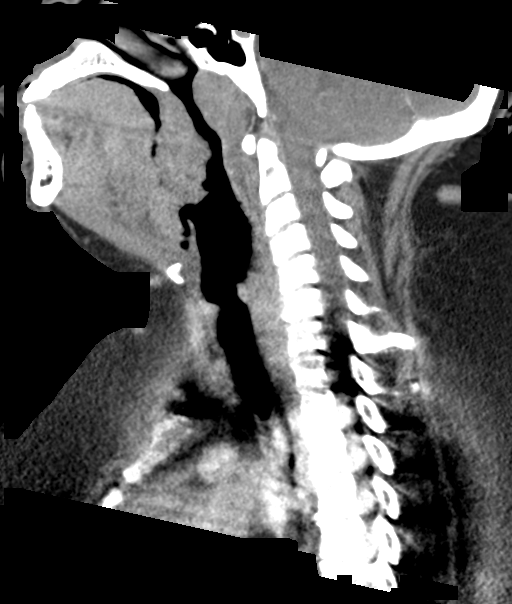
[im 51/101  bone]
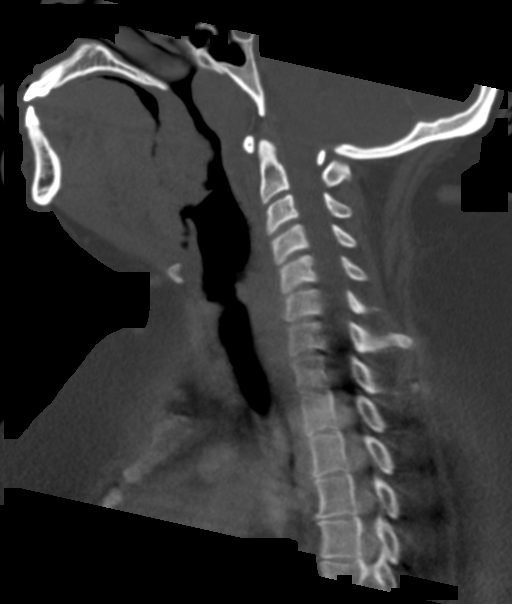
[im 59/101  bone]
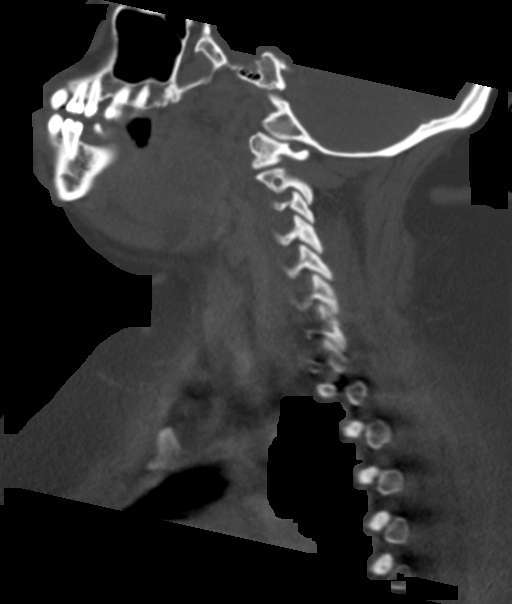
[im 67/101  bone]
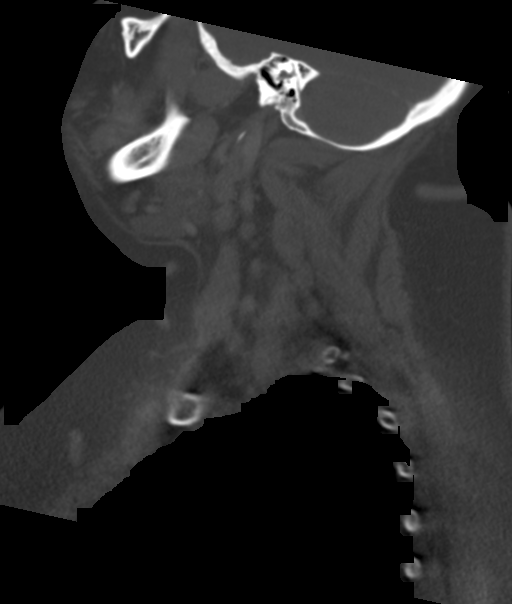

[Series 6: neck with st · axial · 0.39mm/px · z∈[-226,-74]mm · 5 of 115 slices shown, 7 images]
[im 20/115  soft-tissue]
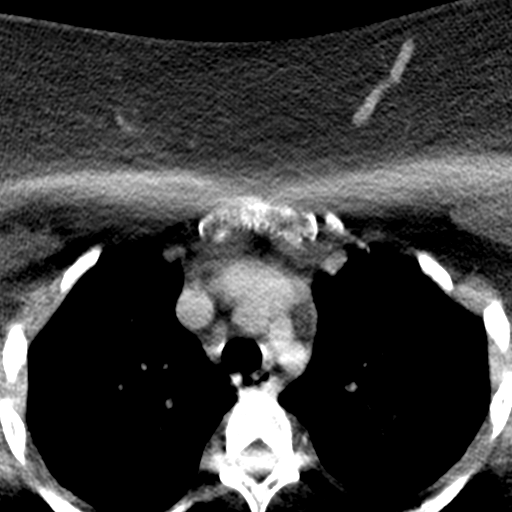
[im 20/115  bone]
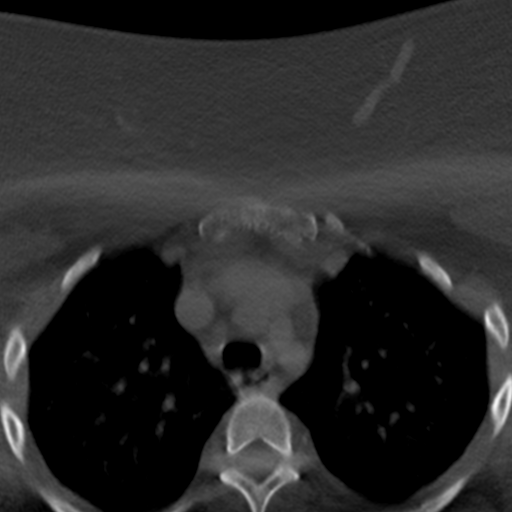
[im 39/115  bone]
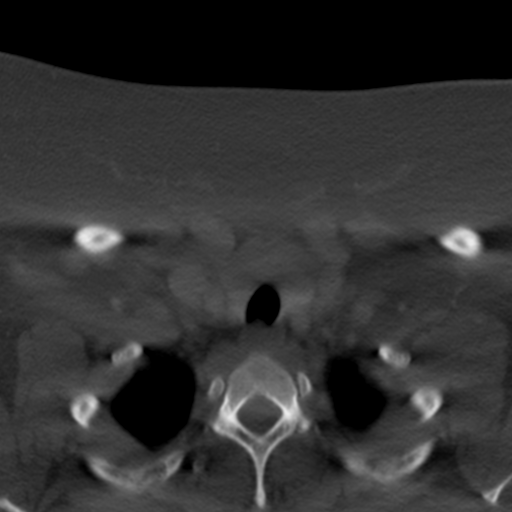
[im 58/115  bone]
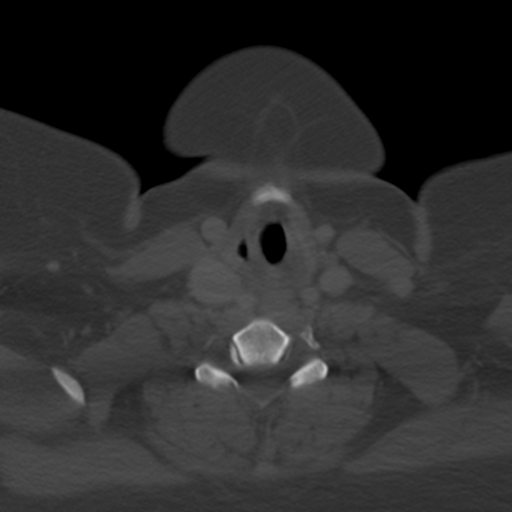
[im 77/115  bone]
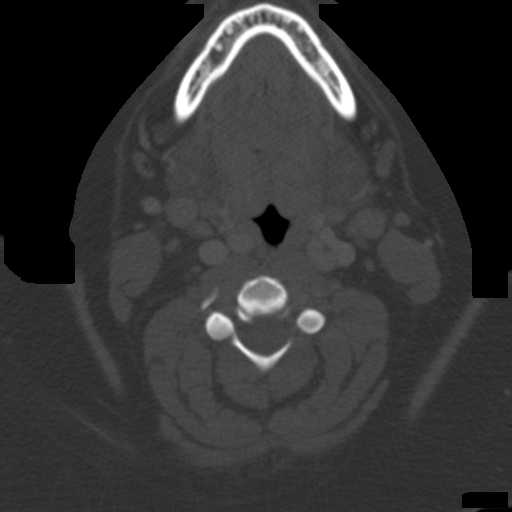
[im 96/115  soft-tissue]
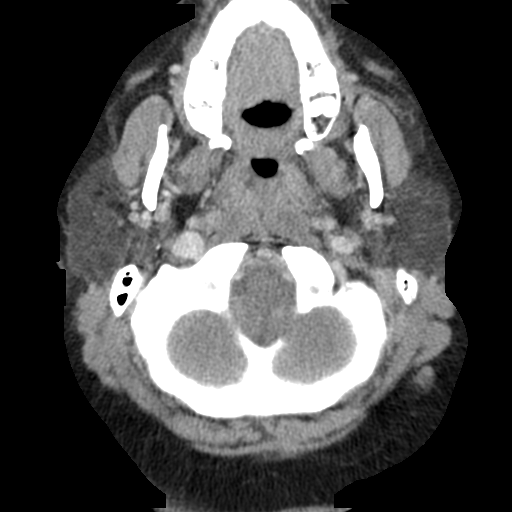
[im 96/115  bone]
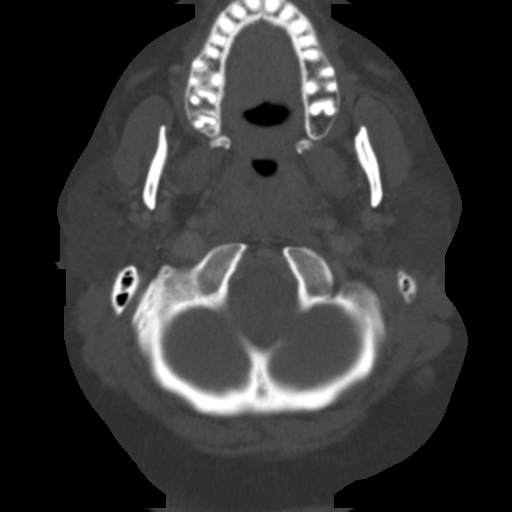

[13 of 33 positions shown; findings below may reference images not displayed]

FINDINGS: Pharynx and larynx: Oral cavity within normal limits. Scattered
dental caries noted. No acute abnormality about the dentition.

Palatine tonsils are enlarged and hyperenhancing, suggesting acute
tonsillitis. No tonsillar or peritonsillar abscess. Adenoidal soft
tissues prominent as well. Mild associated induration within the
left parapharyngeal fat. Right parapharyngeal 5 preserved. Remainder
of the oropharynx within normal limits. Epiglottis normal. Vallecula
clear. Retropharyngeal soft tissues within normal limits. Remainder
of the hypopharynx and supraglottic larynx within normal limits.
True cords symmetric and normal. Subglottic airway clear.

Salivary glands: Salivary glands and parotid glands within normal
limits.

Thyroid: Thyroid normal.

Lymph nodes: Mildly prominent level 2 lymph nodes measure up to 1 cm
on the left and 1.4 cm on the right, likely reactive. No other
pathologically enlarged lymph nodes identified within the neck.

Vascular: Normal intravascular enhancement seen throughout the neck.
Carotid artery is partially medialized into the retropharyngeal
space.

Limited intracranial: Unremarkable.

Visualized orbits: Not well evaluated on this exam.

Mastoids and visualized paranasal sinuses: Mild scattered mucosal
thickening within the visualized ethmoidal air cells. Paranasal
sinuses are otherwise clear. No mastoid effusion. Middle ear
cavities clear.

Skeleton: No acute osseous abnormality. No worrisome lytic or
blastic osseous lesions.

Upper chest: Visualized upper mediastinum within normal limits.
Partially visualized lungs are clear.

Other: No other significant finding.
IMPRESSION: 1. Symmetric enlargement of the palatine tonsils bilaterally,
suggesting acute tonsillitis. No tonsillar or peritonsillar abscess.
No drainable fluid collection.
2. Mildly prominent bilateral level 2 lymph nodes, likely reactive.
3. Scattered dental caries.
